# Patient Record
Sex: Male | Born: 1965 | Race: White | Hispanic: No | Marital: Married | Smoking: Former smoker
Health system: Southern US, Community
[De-identification: ages and names within clinical notes are randomized; demographics above are authoritative.]

## PROBLEM LIST (undated history)

## (undated) DIAGNOSIS — F32A Depression, unspecified: Secondary | ICD-10-CM

## (undated) DIAGNOSIS — F419 Anxiety disorder, unspecified: Secondary | ICD-10-CM

## (undated) HISTORY — PX: OTHER SURGICAL HISTORY: SHX169

---

## 2007-05-19 ENCOUNTER — Emergency Department (HOSPITAL_COMMUNITY): Admission: EM | Admit: 2007-05-19 | Discharge: 2007-05-19 | Payer: Self-pay | Admitting: Emergency Medicine

## 2007-10-16 ENCOUNTER — Encounter: Admission: RE | Admit: 2007-10-16 | Discharge: 2007-10-16 | Payer: Self-pay | Admitting: Orthopedic Surgery

## 2012-08-26 ENCOUNTER — Emergency Department (HOSPITAL_COMMUNITY): Payer: BC Managed Care – PPO

## 2012-08-26 ENCOUNTER — Emergency Department (HOSPITAL_COMMUNITY)
Admission: EM | Admit: 2012-08-26 | Discharge: 2012-08-27 | Disposition: A | Discharge status: Home / Self Care | Payer: BC Managed Care – PPO | Attending: Emergency Medicine | Admitting: Emergency Medicine

## 2012-08-26 ENCOUNTER — Encounter (HOSPITAL_COMMUNITY): Payer: Self-pay | Admitting: *Deleted

## 2012-08-26 DIAGNOSIS — M25519 Pain in unspecified shoulder: Secondary | ICD-10-CM | POA: Insufficient documentation

## 2012-08-26 MED ORDER — OXYCODONE-ACETAMINOPHEN 5-325 MG PO TABS
2.0000 | ORAL_TABLET | Freq: Once | ORAL | Status: AC
  Administered 2012-08-26: 2 via ORAL
  Filled 2012-08-26: qty 2

## 2012-08-26 NOTE — ED Notes (Signed)
The pt has a painful swollen rt clavicle he was playing softball and he collided with another player.

## 2012-08-27 ENCOUNTER — Other Ambulatory Visit (HOSPITAL_COMMUNITY): Payer: Self-pay

## 2012-08-27 ENCOUNTER — Emergency Department (HOSPITAL_COMMUNITY): Payer: BC Managed Care – PPO

## 2012-08-27 MED ORDER — OXYCODONE-ACETAMINOPHEN 5-325 MG PO TABS: 1.0000 | ORAL_TABLET | Freq: Four times a day (QID) | ORAL | Status: AC | PRN

## 2012-08-27 MED ORDER — NAPROXEN 500 MG PO TABS: 500.0000 mg | ORAL_TABLET | Freq: Two times a day (BID) | ORAL | Status: AC

## 2012-08-27 NOTE — ED Provider Notes (Signed)
History     CSN: [id number]  Arrival date & time 08/26/12  2204   First MD Initiated Contact with Patient 08/26/12 2344      Chief Complaint  Patient presents with  . Shoulder Injury    (Consider location/radiation/quality/duration/timing/severity/associated sxs/prior treatment) HPI Comments: Patient presents with complaint of right shoulder pain that began acutely when he collided with another player while playing softball. Injury occurred at approximately 9 PM. No treatments prior to arrival. Patient has pain and swelling over his right shoulder anteriorly in the area of the ac joint. Pain is constant. He denies numbness, tingling in hands. Patient's states that movement makes the pain worse. Nothing makes it better.  Patient is a 46 y.o. male presenting with shoulder injury. The history is provided by the patient and the spouse.  Shoulder Injury This is a new problem. The current episode started today. The problem occurs constantly. The problem has been unchanged. Associated symptoms include arthralgias. Pertinent negatives include no joint swelling, neck pain, numbness or weakness. The symptoms are aggravated by bending. He has tried nothing for the symptoms.    History reviewed. No pertinent past medical history.  History reviewed. No pertinent past surgical history.  No family history on file.  History  Substance Use Topics  . Smoking status: Never Smoker   . Smokeless tobacco: Not on file  . Alcohol Use: Yes      Review of Systems  HENT: Negative for neck pain.   Musculoskeletal: Positive for arthralgias. Negative for back pain, joint swelling and gait problem.  Skin: Negative for wound.  Neurological: Negative for weakness and numbness.    Allergies  Review of patient's allergies indicates no known allergies.  Home Medications   Current Outpatient Rx  Name Route Sig Dispense Refill  . IBUPROFEN 200 MG PO TABS Oral Take 200 mg by mouth every 6 (six) hours as  needed. For pain      BP 105/72  Pulse 72  Temp 98.8 F (37.1 C) (Oral)  Resp 16  SpO2 99%  Physical Exam  Nursing note and vitals reviewed. Constitutional: He is oriented to person, place, and time. He appears well-developed and well-nourished.  HENT:  Head: Normocephalic and atraumatic.  Eyes: Conjunctivae are normal.  Neck: Normal range of motion. Neck supple.  Cardiovascular: Normal pulses.   Pulses:      Radial pulses are 2+ on the right side, and 2+ on the left side.  Musculoskeletal: He exhibits tenderness. He exhibits no edema.       Right shoulder: He exhibits decreased range of motion, tenderness (Over AC joint), bony tenderness and pain. He exhibits no swelling, no effusion and no deformity.       Right elbow: Normal.      Right wrist: Normal.       Cervical back: Normal. He exhibits normal range of motion and no tenderness.       Right upper arm: Normal.       Right forearm: Normal.       Patient cannot externally rotate or lift arm due to pain. 2+ radial pulses. Compartments of forearm soft.   Neurological: He is alert and oriented to person, place, and time. No sensory deficit.       Motor, sensation, and vascular distal to the injury is fully intact.   Skin: Skin is warm and dry.  Psychiatric: He has a normal mood and affect.    ED Course  Procedures (including critical care time)  Labs  Reviewed - No data to display Dg Clavicle Right  08/26/2012  *RADIOLOGY REPORT*  Clinical Data: Anterior right shoulder and superior clavicle pain after falling and landing on the shoulder, feeling a pop.  RIGHT CLAVICLE - 2+ VIEWS  Comparison: None.  Findings: Two views of the right clavicle demonstrate normal appearing bones and soft tissues without fracture or dislocation.  IMPRESSION: Normal examination.   Original Report Authenticated By: Darrol Angel, M.D.      1. Shoulder pain     11:45 PM Patient seen and examined. X-rays reviewed. Additional shoulder views  ordered. Medications ordered. Sling by ortho tech.   Vital signs reviewed and are as follows: Filed Vitals:   08/26/12 2234  BP: 105/72  Pulse: 72  Temp: 98.8 F (37.1 C)  Resp: 16   1:00 AM patient informed of x-ray findings. Urged followup with orthopedic physician if not improved in 5-7 days. Counseled patient on rice protocol. Counseled patient not to use right arm for the next week.  Patient counseled on use of narcotic pain medications. Counseled not to combine these medications with others containing tylenol. Urged not to drink alcohol, drive, or perform any other activities that requires focus while taking these medications. The patient verbalizes understanding and agrees with the plan.   No contraindcations of NSAID use.   MDM  Patient with likely type 1/type II acromioclavicular injury. Will treat conservatively with rice protocol, swelling, pain medicine, anti-inflammatory medications. Patient has an orthopedic doctor for followup if not improved. Patient counseled to rest shoulder. X-rays are negative for fracture, dislocation. Compartments of forearm are soft. 2+ radial pulse bilaterally. Do not suspect hand, wrist, or elbow injuries. No neck injury.        Renne Crigler, [place] 08/27/12 [phone]

## 2012-08-27 NOTE — ED Provider Notes (Signed)
Medical screening examination/treatment/procedure(s) were performed by non-physician practitioner and as supervising physician I was immediately available for consultation/collaboration.    Vida Roller, MD 08/27/12 [phone]

## 2013-09-21 IMAGING — CR DG CLAVICLE*R*
2 series · 2 of 2 positions shown · non-contrast
Comparison: None.

CLINICAL DATA: Anterior right shoulder and superior clavicle pain
after falling and landing on the shoulder, feeling a pop.

RIGHT CLAVICLE - 2+ VIEWS

[w clavicle ap right *]
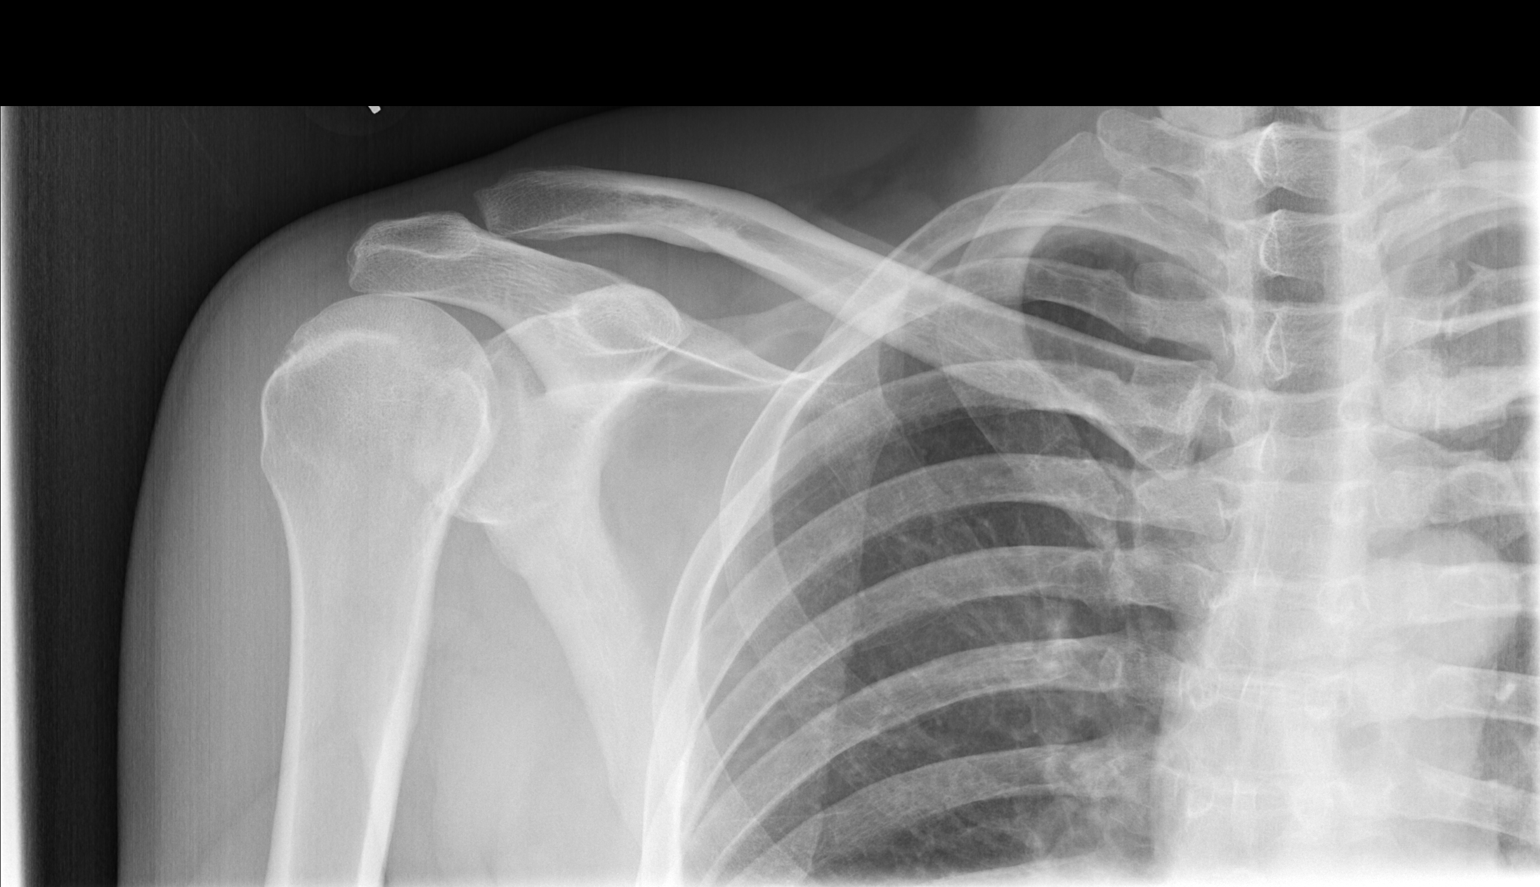

[w clavicle tangential right *]
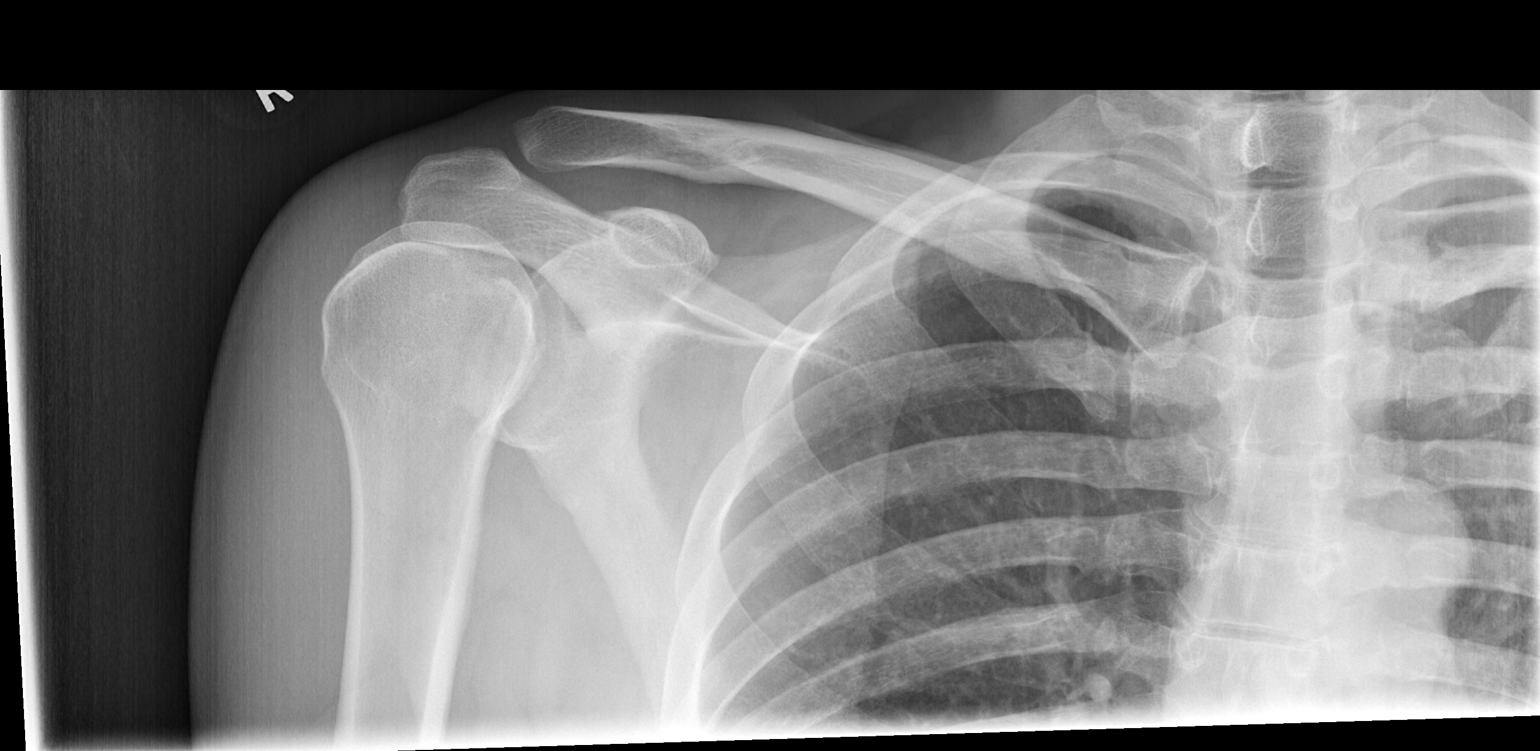

[2 of 2 positions shown; findings below may reference images not displayed]

FINDINGS: Two views of the right clavicle demonstrate normal
appearing bones and soft tissues without fracture or dislocation.
IMPRESSION: Normal examination.

## 2013-09-22 IMAGING — CR DG SHOULDER 2+V*R*
2 series · 2 of 2 positions shown · non-contrast
Comparison: Right clavicle 08/26/2012.

CLINICAL DATA: Right shoulder pain after fall.

RIGHT SHOULDER - 2+ VIEW

[w shoulder ap internal righ]
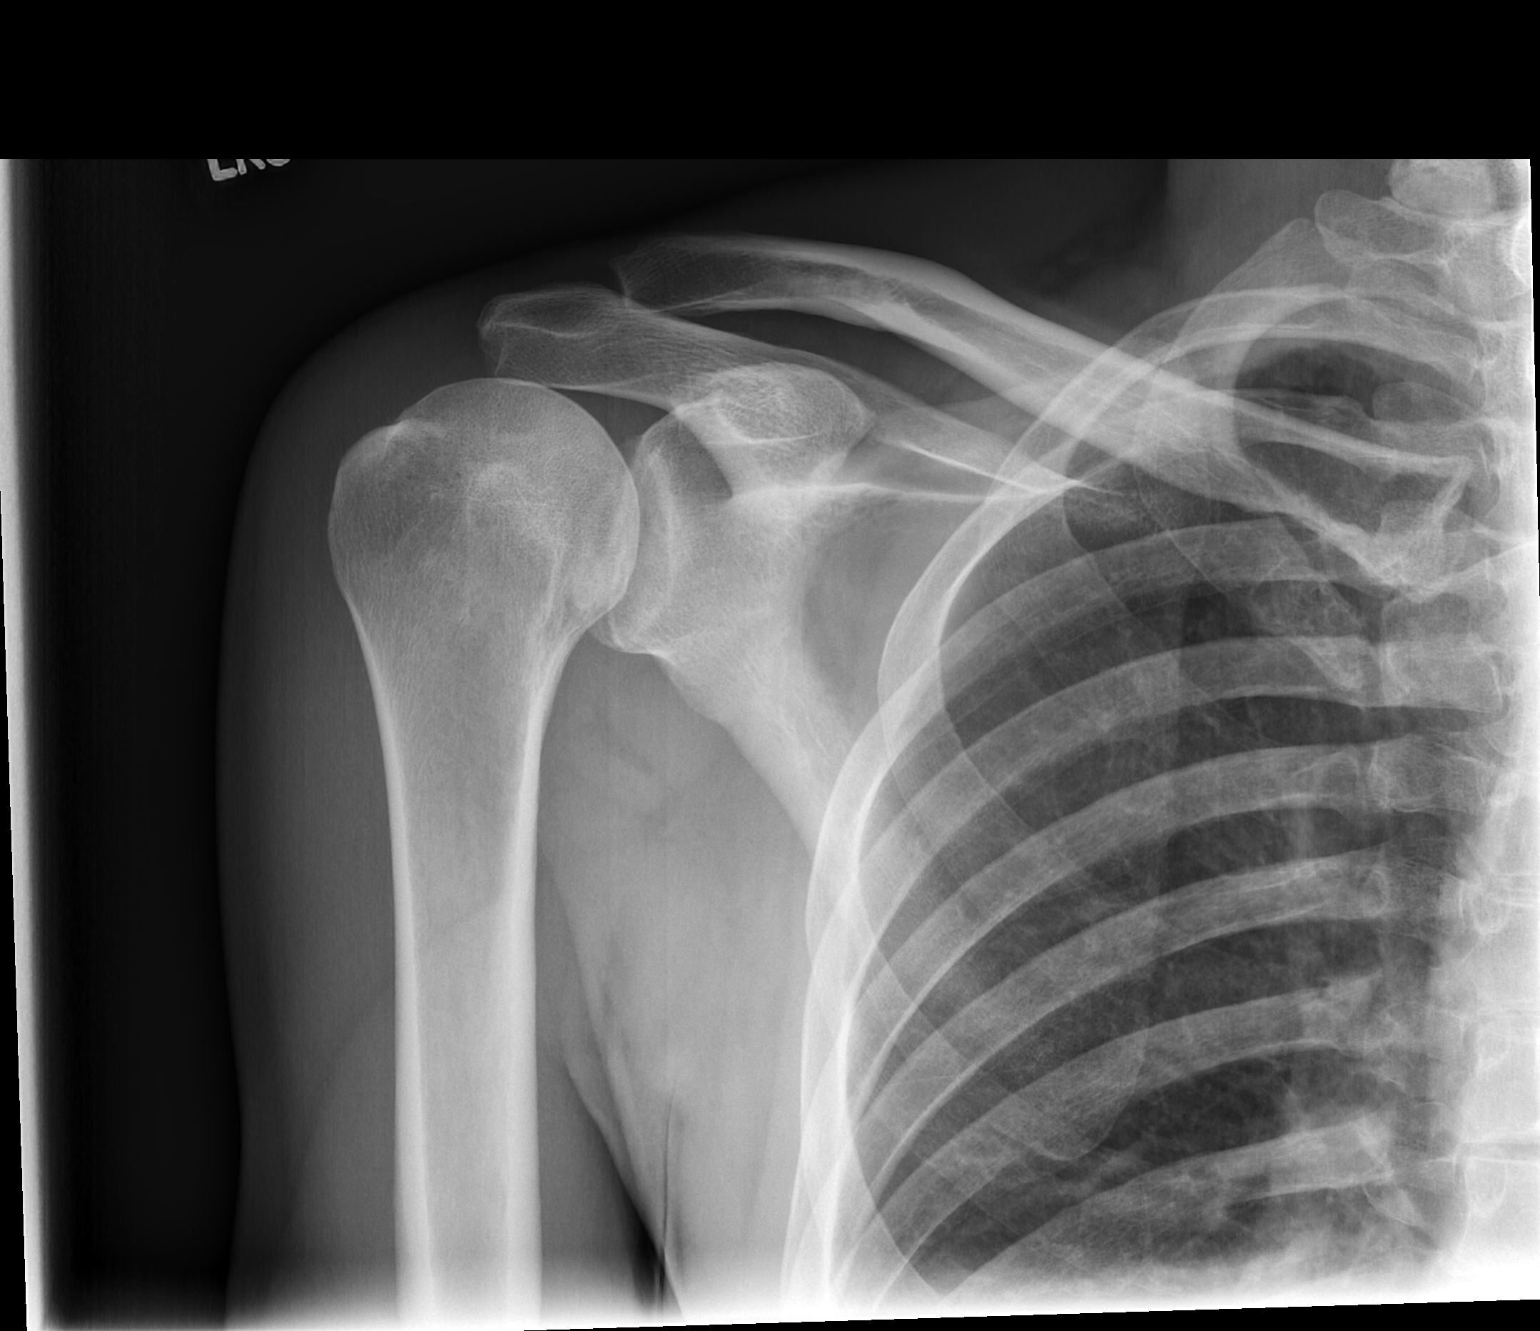

[w shoulder ap external righ]
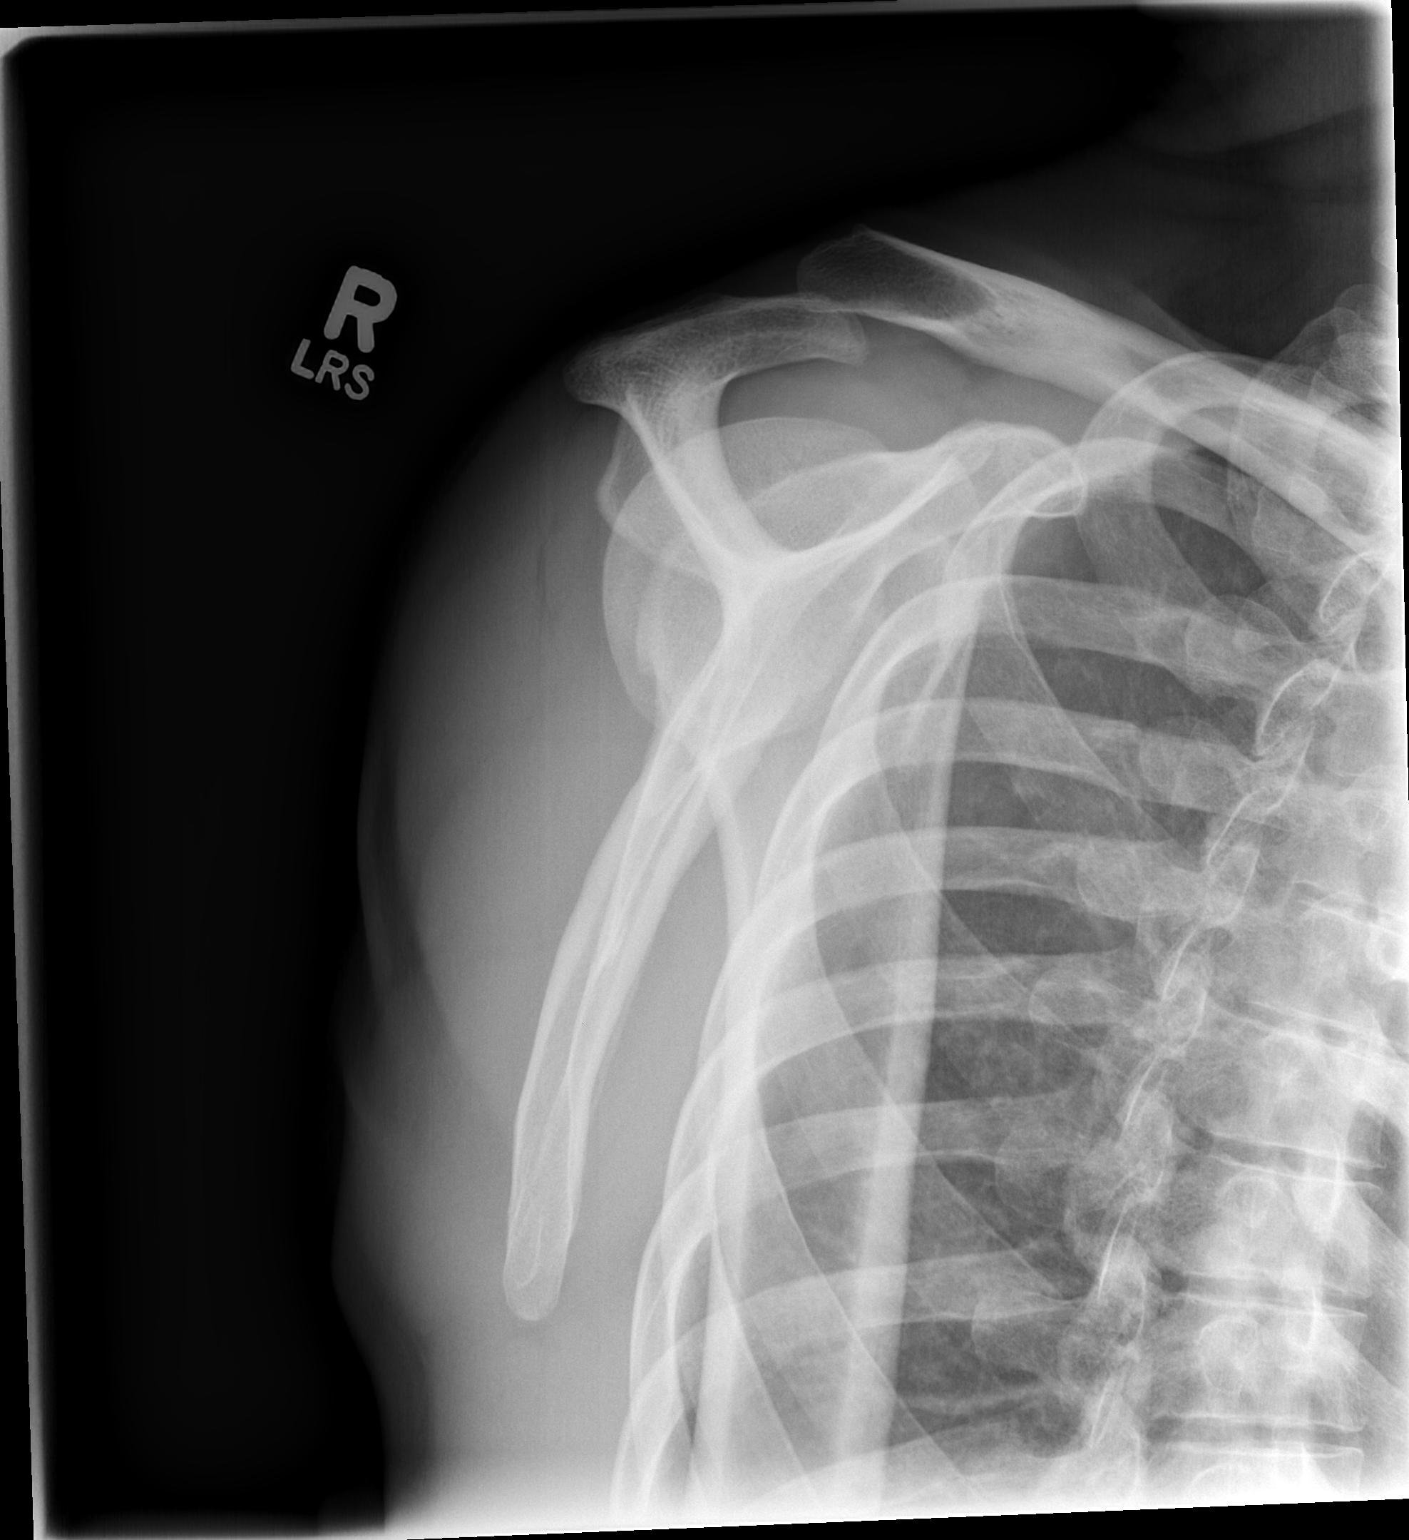

[2 of 2 positions shown; findings below may reference images not displayed]

FINDINGS: Right shoulder appears intact. No evidence of acute
fracture or subluxation.  No focal bone lesions.  Bone matrix and
cortex appear intact.  No abnormal radiopaque densities in the soft
tissues.
IMPRESSION: No acute bony abnormalities.

## 2022-03-30 ENCOUNTER — Other Ambulatory Visit: Payer: Self-pay | Admitting: Urology

## 2022-05-08 NOTE — Progress Notes (Addendum)
DUE TO COVID-19 ONLY  2 VISITOR IS ALLOWED TO COME WITH YOU AND STAY IN THE WAITING ROOM ONLY DURING PRE OP AND PROCEDURE DAY OF SURGERY.   4 VISITOR  MAY VISIT WITH YOU AFTER SURGERY IN YOUR PRIVATE ROOM DURING VISITING HOURS ONLY! YOU MAY HAVE ONE PERSON SPEND THE NITE WITH YOU IN YOUR ROOM AFTER SURGERY.      Your procedure is scheduled on:                          05/17/22   Report to [facility] Main  Entrance   Report to admitting at   0945am              AM DO NOT BRING INSURANCE CARD, PICTURE ID OR WALLET DAY OF SURGERY.      Call this number if you have problems the morning of surgery [phone]  Follow bowel prep instructions per DR Louis Meckel.     REMEMBER: NO  SOLID FOODS , CANDY, GUM OR MINTS AFTER MIDNITE THE NITE BEFORE SURGERY .       Marland Kitchen CLEAR LIQUIDS UNTIL   0900am morning of surgery.         CLEAR LIQUID DIET   Foods Allowed      WATER BLACK COFFEE ( SUGAR OK, NO MILK, CREAM OR CREAMER) REGULAR AND DECAF  TEA ( SUGAR OK NO MILK, CREAM, OR CREAMER) REGULAR AND DECAF  PLAIN JELLO ( NO RED)  FRUIT ICES ( NO RED, NO FRUIT PULP)  POPSICLES ( NO RED)  JUICE- APPLE, WHITE GRAPE AND WHITE CRANBERRY  SPORT DRINK LIKE GATORADE ( NO RED)  CLEAR BROTH ( VEGETABLE , CHICKEN OR BEEF)                                                                     BRUSH YOUR TEETH MORNING OF SURGERY AND RINSE YOUR MOUTH OUT, NO CHEWING GUM CANDY OR MINTS.     Take these medicines the morning of surgery with A SIP OF WATER:  zyrtec if needed    DO NOT TAKE ANY DIABETIC MEDICATIONS DAY OF YOUR SURGERY                               You may not have any metal on your body including hair pins and              piercings  Do not wear jewelry, make-up, lotions, powders or perfumes, deodorant             Do not wear nail polish on your fingernails.              IF YOU ARE A MALE AND WANT TO SHAVE UNDER ARMS OR LEGS PRIOR TO SURGERY YOU MUST DO SO AT LEAST 48 HOURS PRIOR TO  SURGERY.              Men may shave face and neck.   Do not bring valuables to the hospital. [place].  Contacts, dentures or bridgework may not  be worn into surgery.  Leave suitcase in the car. After surgery it may be brought to your room.     Patients discharged the day of surgery will not be allowed to drive home. IF YOU ARE HAVING SURGERY AND GOING HOME THE SAME DAY, YOU MUST HAVE AN ADULT TO DRIVE YOU HOME AND BE WITH YOU FOR 24 HOURS. YOU MAY GO HOME BY TAXI OR UBER OR ORTHERWISE, BUT AN ADULT MUST ACCOMPANY YOU HOME AND STAY WITH YOU FOR 24 HOURS.                Please read over the following fact sheets you were given: _____________________________________________________________________  [facility] - Preparing for Surgery Before surgery, you can play an important role.  Because skin is not sterile, your skin needs to be as free of germs as possible.  You can reduce the number of germs on your skin by washing with CHG (chlorahexidine gluconate) soap before surgery.  CHG is an antiseptic cleaner which kills germs and bonds with the skin to continue killing germs even after washing. Please DO NOT use if you have an allergy to CHG or antibacterial soaps.  If your skin becomes reddened/irritated stop using the CHG and inform your nurse when you arrive at Short Stay. Do not shave (including legs and underarms) for at least 48 hours prior to the first CHG shower.  You may shave your face/neck. Please follow these instructions carefully:  1.  Shower with CHG Soap the night before surgery and the  morning of Surgery.  2.  If you choose to wash your hair, wash your hair first as usual with your  normal  shampoo.  3.  After you shampoo, rinse your hair and body thoroughly to remove the  shampoo.                           4.  Use CHG as you would any other liquid soap.  You can apply chg directly  to the skin and wash                       Gently  with a scrungie or clean washcloth.  5.  Apply the CHG Soap to your body ONLY FROM THE NECK DOWN.   Do not use on face/ open                           Wound or open sores. Avoid contact with eyes, ears mouth and genitals (private parts).                       Wash face,  Genitals (private parts) with your normal soap.             6.  Wash thoroughly, paying special attention to the area where your surgery  will be performed.  7.  Thoroughly rinse your body with warm water from the neck down.  8.  DO NOT shower/wash with your normal soap after using and rinsing off  the CHG Soap.                9.  Pat yourself dry with a clean towel.            10.  Wear clean pajamas.            11.  Place clean sheets on your bed  the night of your first shower and do not  sleep with pets. Day of Surgery : Do not apply any lotions/deodorants the morning of surgery.  Please wear clean clothes to the hospital/surgery center.  FAILURE TO FOLLOW THESE INSTRUCTIONS MAY RESULT IN THE CANCELLATION OF YOUR SURGERY PATIENT SIGNATURE_________________________________  NURSE SIGNATURE__________________________________  ________________________________________________________________________

## 2022-05-08 NOTE — Progress Notes (Signed)
LVMMM for Jim Warner at [facility] regarding preop orders .  NO  clear liquid diet ordered for day before surgery and miralax order is not complete.

## 2022-05-08 NOTE — Progress Notes (Signed)
Anesthesia Review:  PCP: Cardiologist : Chest x-ray : EKG : Echo : Stress test: Cardiac Cath :  Activity level:  Sleep Study/ CPAP : Fasting Blood Sugar :      / Checks Blood Sugar -- times a day:   Blood Thinner/ Instructions /Last Dose: ASA / Instructions/ Last Dose :  

## 2022-05-10 ENCOUNTER — Encounter (HOSPITAL_COMMUNITY): Payer: Self-pay

## 2022-05-10 ENCOUNTER — Other Ambulatory Visit: Payer: Self-pay

## 2022-05-10 ENCOUNTER — Encounter (HOSPITAL_COMMUNITY)
Admission: RE | Admit: 2022-05-10 | Discharge: 2022-05-10 | Disposition: A | Discharge status: Home / Self Care | Payer: No Typology Code available for payment source | Source: Ambulatory Visit | Attending: Urology | Admitting: Urology

## 2022-05-10 VITALS — BP 121/83 | HR 59 | Temp 97.8°F | Resp 16 | Ht 73.0 in | Wt 179.0 lb

## 2022-05-10 DIAGNOSIS — Z01812 Encounter for preprocedural laboratory examination: Secondary | ICD-10-CM | POA: Insufficient documentation

## 2022-05-10 DIAGNOSIS — Z01818 Encounter for other preprocedural examination: Secondary | ICD-10-CM

## 2022-05-10 HISTORY — DX: Anxiety disorder, unspecified: F41.9

## 2022-05-10 HISTORY — DX: Depression, unspecified: F32.A

## 2022-05-10 LAB — CBC
HCT: 44.8 % (ref 39.0–52.0)
Hemoglobin: 15.5 g/dL (ref 13.0–17.0)
MCH: 32.3 pg (ref 26.0–34.0)
MCHC: 34.6 g/dL (ref 30.0–36.0)
MCV: 93.3 fL (ref 80.0–100.0)
Platelets: 284 10*3/uL (ref 150–400)
RBC: 4.8 MIL/uL (ref 4.22–5.81)
RDW: 12.1 % (ref 11.5–15.5)
WBC: 6 10*3/uL (ref 4.0–10.5)
nRBC: 0 % (ref 0.0–0.2)

## 2022-05-10 LAB — COMPREHENSIVE METABOLIC PANEL
ALT: 57 U/L — ABNORMAL HIGH (ref 0–44)
AST: 56 U/L — ABNORMAL HIGH (ref 15–41)
Albumin: 4.3 g/dL (ref 3.5–5.0)
Alkaline Phosphatase: 65 U/L (ref 38–126)
Anion gap: 7 (ref 5–15)
BUN: 10 mg/dL (ref 6–20)
CO2: 26 mmol/L (ref 22–32)
Calcium: 9.3 mg/dL (ref 8.9–10.3)
Chloride: 107 mmol/L (ref 98–111)
Creatinine, Ser: 0.8 mg/dL (ref 0.61–1.24)
GFR, Estimated: >60 mL/min (ref >60)
Glucose, Bld: 81 mg/dL (ref 70–99)
Potassium: 3.9 mmol/L (ref 3.5–5.1)
Sodium: 140 mmol/L (ref 135–145)
Total Bilirubin: 0.7 mg/dL (ref 0.3–1.2)
Total Protein: 7.5 g/dL (ref 6.5–8.1)

## 2022-05-11 LAB — URINE CULTURE: Culture: NO GROWTH

## 2022-05-16 NOTE — Anesthesia Preprocedure Evaluation (Addendum)
Anesthesia Evaluation  Patient identified by MRN, date of birth, ID band Patient awake    Reviewed: Allergy & Precautions, NPO status , Patient's Chart, lab work & pertinent test results  Airway Mallampati: I  TM Distance: >3 FB Neck ROM: Full    Dental no notable dental hx. (+) Teeth Intact, Dental Advisory Given   Pulmonary former smoker,    Pulmonary exam normal breath sounds clear to auscultation       Cardiovascular Exercise Tolerance: Good Normal cardiovascular exam Rhythm:Regular Rate:Normal     Neuro/Psych Anxiety Depression    GI/Hepatic negative GI ROS, Neg liver ROS,   Endo/Other  negative endocrine ROS  Renal/GU Lab Results      Component                Value               Date                      CREATININE               0.80                05/10/2022               K                        3.9                 05/10/2022                    Musculoskeletal  (+) Arthritis ,   Abdominal   Peds  Hematology Lab Results      Component                Value               Date                         HGB                      15.5                05/10/2022                HCT                      44.8                05/10/2022                    PLT                      284                 05/10/2022              Anesthesia Other Findings   Reproductive/Obstetrics                           Anesthesia Physical Anesthesia Plan  ASA: 3  Anesthesia Plan: General   Post-op Pain Management: Ketamine IV*, Lidocaine infusion* and Ofirmev IV (intra-op)*   Induction: Intravenous  PONV Risk Score and Plan: 3 and Treatment may vary due to age or medical condition,  Midazolam, Dexamethasone and Ondansetron  Airway Management Planned: Oral ETT  Additional Equipment: None  Intra-op Plan:   Post-operative Plan:   Informed Consent: I have reviewed the patients History and Physical, chart,  labs and discussed the procedure including the risks, benefits and alternatives for the proposed anesthesia with the patient or authorized representative who has indicated his/her understanding and acceptance.     Dental advisory given  Plan Discussed with: CRNA and Anesthesiologist  Anesthesia Plan Comments:        Anesthesia Quick Evaluation

## 2022-05-17 ENCOUNTER — Encounter (HOSPITAL_COMMUNITY)
Admission: RE | Disposition: A | Discharge status: Home / Self Care | Payer: Self-pay | Source: Ambulatory Visit | Attending: Urology

## 2022-05-17 ENCOUNTER — Ambulatory Visit (HOSPITAL_BASED_OUTPATIENT_CLINIC_OR_DEPARTMENT_OTHER): Payer: No Typology Code available for payment source | Admitting: Certified Registered Nurse Anesthetist

## 2022-05-17 ENCOUNTER — Encounter (HOSPITAL_COMMUNITY): Payer: Self-pay | Admitting: Urology

## 2022-05-17 ENCOUNTER — Other Ambulatory Visit: Payer: Self-pay

## 2022-05-17 ENCOUNTER — Ambulatory Visit (HOSPITAL_COMMUNITY): Payer: No Typology Code available for payment source | Admitting: Physician Assistant

## 2022-05-17 ENCOUNTER — Observation Stay (HOSPITAL_COMMUNITY)
Admission: RE | Admit: 2022-05-17 | Discharge: 2022-05-18 | Disposition: A | Discharge status: Home / Self Care | Payer: No Typology Code available for payment source | Source: Ambulatory Visit | Attending: Urology | Admitting: Urology

## 2022-05-17 DIAGNOSIS — M1990 Unspecified osteoarthritis, unspecified site: Secondary | ICD-10-CM

## 2022-05-17 DIAGNOSIS — F418 Other specified anxiety disorders: Secondary | ICD-10-CM | POA: Diagnosis not present

## 2022-05-17 DIAGNOSIS — C61 Malignant neoplasm of prostate: Secondary | ICD-10-CM

## 2022-05-17 DIAGNOSIS — Z01818 Encounter for other preprocedural examination: Secondary | ICD-10-CM

## 2022-05-17 HISTORY — PX: ROBOT ASSISTED LAPAROSCOPIC RADICAL PROSTATECTOMY: SHX5141

## 2022-05-17 HISTORY — PX: PELVIC LYMPH NODE DISSECTION: SHX6543

## 2022-05-17 LAB — TYPE AND SCREEN
ABO/RH(D): O POS
Antibody Screen: NEGATIVE

## 2022-05-17 LAB — HEMOGLOBIN AND HEMATOCRIT, BLOOD
HCT: 45 % (ref 39.0–52.0)
Hemoglobin: 14.6 g/dL (ref 13.0–17.0)

## 2022-05-17 LAB — ABO/RH: ABO/RH(D): O POS

## 2022-05-17 SURGERY — PROSTATECTOMY, RADICAL, ROBOT-ASSISTED, LAPAROSCOPIC
Anesthesia: General | Site: Abdomen

## 2022-05-17 MED ORDER — HYDROMORPHONE HCL 2 MG/ML IJ SOLN
INTRAMUSCULAR | Status: AC
  Filled 2022-05-17: qty 1

## 2022-05-17 MED ORDER — FENTANYL CITRATE (PF) 250 MCG/5ML IJ SOLN
INTRAMUSCULAR | Status: AC
  Filled 2022-05-17: qty 5

## 2022-05-17 MED ORDER — AMISULPRIDE (ANTIEMETIC) 5 MG/2ML IV SOLN: 10.0000 mg | Freq: Once | INTRAVENOUS | Status: DC | PRN

## 2022-05-17 MED ORDER — TRAMADOL HCL 50 MG PO TABS
50.0000 mg | ORAL_TABLET | Freq: Four times a day (QID) | ORAL | Status: DC | PRN
  Administered 2022-05-17 – 2022-05-18 (×3): 100 mg via ORAL
  Filled 2022-05-17 (×3): qty 2

## 2022-05-17 MED ORDER — HYOSCYAMINE SULFATE 0.125 MG SL SUBL
0.1250 mg | SUBLINGUAL_TABLET | SUBLINGUAL | Status: DC | PRN
  Administered 2022-05-17: 0.125 mg via SUBLINGUAL
  Filled 2022-05-17 (×2): qty 1

## 2022-05-17 MED ORDER — MIDAZOLAM HCL 5 MG/5ML IJ SOLN
INTRAMUSCULAR | Status: DC | PRN
  Administered 2022-05-17: 2 mg via INTRAVENOUS

## 2022-05-17 MED ORDER — POLYETHYLENE GLYCOL 3350 17 GM/SCOOP PO POWD: 1.0000 | Freq: Once | ORAL | Status: DC

## 2022-05-17 MED ORDER — OXYCODONE HCL 5 MG PO TABS: 5.0000 mg | ORAL_TABLET | Freq: Once | ORAL | Status: DC | PRN

## 2022-05-17 MED ORDER — DEXAMETHASONE SODIUM PHOSPHATE 10 MG/ML IJ SOLN
INTRAMUSCULAR | Status: AC
  Filled 2022-05-17: qty 1

## 2022-05-17 MED ORDER — ROCURONIUM BROMIDE 10 MG/ML (PF) SYRINGE
PREFILLED_SYRINGE | INTRAVENOUS | Status: AC
  Filled 2022-05-17: qty 10

## 2022-05-17 MED ORDER — LIDOCAINE HCL (PF) 2 % IJ SOLN
INTRAMUSCULAR | Status: DC | PRN
  Administered 2022-05-17: 1.5 mg/kg/h via INTRADERMAL

## 2022-05-17 MED ORDER — SODIUM CHLORIDE 0.9 % IV BOLUS
1000.0000 mL | Freq: Once | INTRAVENOUS | Status: AC
  Administered 2022-05-17: 1000 mL via INTRAVENOUS

## 2022-05-17 MED ORDER — FENTANYL CITRATE (PF) 100 MCG/2ML IJ SOLN
INTRAMUSCULAR | Status: DC | PRN
Start: 2022-05-17 — End: 2022-05-17
  Administered 2022-05-17: 100 ug via INTRAVENOUS
  Administered 2022-05-17: 50 ug via INTRAVENOUS
  Administered 2022-05-17: 100 ug via INTRAVENOUS
  Administered 2022-05-17 (×2): 50 ug via INTRAVENOUS

## 2022-05-17 MED ORDER — LIDOCAINE HCL 2 % IJ SOLN
INTRAMUSCULAR | Status: AC
  Filled 2022-05-17: qty 20

## 2022-05-17 MED ORDER — DIPHENHYDRAMINE HCL 50 MG/ML IJ SOLN: 12.5000 mg | Freq: Four times a day (QID) | INTRAMUSCULAR | Status: DC | PRN

## 2022-05-17 MED ORDER — HYDROMORPHONE HCL 1 MG/ML IJ SOLN
0.5000 mg | INTRAMUSCULAR | Status: DC | PRN
  Administered 2022-05-17 – 2022-05-18 (×3): 1 mg via INTRAVENOUS
  Filled 2022-05-17 (×4): qty 1

## 2022-05-17 MED ORDER — PROPOFOL 10 MG/ML IV BOLUS
INTRAVENOUS | Status: DC | PRN
  Administered 2022-05-17: 180 mg via INTRAVENOUS

## 2022-05-17 MED ORDER — ACETAMINOPHEN 10 MG/ML IV SOLN
INTRAVENOUS | Status: DC | PRN
  Administered 2022-05-17: 1000 mg via INTRAVENOUS

## 2022-05-17 MED ORDER — SUGAMMADEX SODIUM 200 MG/2ML IV SOLN
INTRAVENOUS | Status: DC | PRN
  Administered 2022-05-17: 200 mg via INTRAVENOUS

## 2022-05-17 MED ORDER — HYDROMORPHONE HCL 1 MG/ML IJ SOLN: 0.2500 mg | INTRAMUSCULAR | Status: DC | PRN

## 2022-05-17 MED ORDER — MIDAZOLAM HCL 2 MG/2ML IJ SOLN
INTRAMUSCULAR | Status: AC
Start: 2022-05-17 — End: ?
  Filled 2022-05-17: qty 2

## 2022-05-17 MED ORDER — DEXAMETHASONE SODIUM PHOSPHATE 4 MG/ML IJ SOLN
INTRAMUSCULAR | Status: DC | PRN
  Administered 2022-05-17: 5 mg via INTRAVENOUS

## 2022-05-17 MED ORDER — DEXTROSE-NACL 5-0.45 % IV SOLN: INTRAVENOUS | Status: DC

## 2022-05-17 MED ORDER — BUPIVACAINE LIPOSOME 1.3 % IJ SUSP
INTRAMUSCULAR | Status: AC
  Filled 2022-05-17: qty 20

## 2022-05-17 MED ORDER — BUPIVACAINE-EPINEPHRINE 0.5% -1:200000 IJ SOLN
INTRAMUSCULAR | Status: DC | PRN
  Administered 2022-05-17: 10 mL
  Administered 2022-05-17: 20 mL

## 2022-05-17 MED ORDER — TRIPLE ANTIBIOTIC 3.5-400-5000 EX OINT
1.0000 "application " | TOPICAL_OINTMENT | Freq: Three times a day (TID) | CUTANEOUS | Status: DC | PRN
  Filled 2022-05-17: qty 1

## 2022-05-17 MED ORDER — CEFAZOLIN SODIUM-DEXTROSE 2-4 GM/100ML-% IV SOLN
INTRAVENOUS | Status: AC
  Filled 2022-05-17: qty 100

## 2022-05-17 MED ORDER — SULFAMETHOXAZOLE-TRIMETHOPRIM 800-160 MG PO TABS: 1.0000 | ORAL_TABLET | Freq: Two times a day (BID) | ORAL | 0 refills | Status: DC

## 2022-05-17 MED ORDER — OXYCODONE HCL 5 MG/5ML PO SOLN: 5.0000 mg | Freq: Once | ORAL | Status: DC | PRN

## 2022-05-17 MED ORDER — ACETAMINOPHEN 10 MG/ML IV SOLN
INTRAVENOUS | Status: AC
  Filled 2022-05-17: qty 100

## 2022-05-17 MED ORDER — ONDANSETRON HCL 4 MG/2ML IJ SOLN
INTRAMUSCULAR | Status: DC | PRN
  Administered 2022-05-17: 4 mg via INTRAVENOUS

## 2022-05-17 MED ORDER — DEXMEDETOMIDINE (PRECEDEX) IN NS 20 MCG/5ML (4 MCG/ML) IV SYRINGE
PREFILLED_SYRINGE | INTRAVENOUS | Status: DC | PRN
  Administered 2022-05-17: 8 ug via INTRAVENOUS

## 2022-05-17 MED ORDER — KETAMINE HCL 10 MG/ML IJ SOLN
INTRAMUSCULAR | Status: DC | PRN
  Administered 2022-05-17: 30 mg via INTRAVENOUS

## 2022-05-17 MED ORDER — TRAMADOL HCL 50 MG PO TABS: 50.0000 mg | ORAL_TABLET | Freq: Four times a day (QID) | ORAL | 0 refills | Status: DC | PRN

## 2022-05-17 MED ORDER — ONDANSETRON HCL 4 MG/2ML IJ SOLN: 4.0000 mg | Freq: Once | INTRAMUSCULAR | Status: DC | PRN

## 2022-05-17 MED ORDER — DOCUSATE SODIUM 100 MG PO CAPS
100.0000 mg | ORAL_CAPSULE | Freq: Two times a day (BID) | ORAL | Status: DC
  Administered 2022-05-17 – 2022-05-18 (×2): 100 mg via ORAL
  Filled 2022-05-17 (×3): qty 1

## 2022-05-17 MED ORDER — HYDROMORPHONE HCL 1 MG/ML IJ SOLN
INTRAMUSCULAR | Status: DC | PRN
  Administered 2022-05-17 (×2): 1 mg via INTRAVENOUS

## 2022-05-17 MED ORDER — PROPOFOL 10 MG/ML IV BOLUS
INTRAVENOUS | Status: AC
  Filled 2022-05-17: qty 20

## 2022-05-17 MED ORDER — LACTATED RINGERS IR SOLN
Status: DC | PRN
  Administered 2022-05-17: 1000 mL

## 2022-05-17 MED ORDER — SODIUM CHLORIDE (PF) 0.9 % IJ SOLN
INTRAMUSCULAR | Status: AC
  Filled 2022-05-17: qty 20

## 2022-05-17 MED ORDER — HEMOSTATIC AGENTS (NO CHARGE) OPTIME
TOPICAL | Status: DC | PRN
  Administered 2022-05-17: 1

## 2022-05-17 MED ORDER — ACETAMINOPHEN 10 MG/ML IV SOLN: 1000.0000 mg | Freq: Once | INTRAVENOUS | Status: DC | PRN

## 2022-05-17 MED ORDER — LIDOCAINE 2% (20 MG/ML) 5 ML SYRINGE
INTRAMUSCULAR | Status: DC | PRN
  Administered 2022-05-17: 100 mg via INTRAVENOUS

## 2022-05-17 MED ORDER — ONDANSETRON HCL 4 MG/2ML IJ SOLN
4.0000 mg | INTRAMUSCULAR | Status: DC | PRN
  Administered 2022-05-18: 4 mg via INTRAVENOUS
  Filled 2022-05-17: qty 2

## 2022-05-17 MED ORDER — ONDANSETRON HCL 4 MG/2ML IJ SOLN
INTRAMUSCULAR | Status: AC
  Filled 2022-05-17: qty 2

## 2022-05-17 MED ORDER — CEFAZOLIN SODIUM-DEXTROSE 2-4 GM/100ML-% IV SOLN
2.0000 g | INTRAVENOUS | Status: AC
  Administered 2022-05-17: 2 g via INTRAVENOUS

## 2022-05-17 MED ORDER — BUPIVACAINE LIPOSOME 1.3 % IJ SUSP
INTRAMUSCULAR | Status: DC | PRN
  Administered 2022-05-17: 20 mL

## 2022-05-17 MED ORDER — ORAL CARE MOUTH RINSE: 15.0000 mL | Freq: Once | OROMUCOSAL | Status: AC

## 2022-05-17 MED ORDER — DOCUSATE SODIUM 100 MG PO CAPS: 100.0000 mg | ORAL_CAPSULE | Freq: Two times a day (BID) | ORAL | Status: DC

## 2022-05-17 MED ORDER — CHLORHEXIDINE GLUCONATE 0.12 % MT SOLN
15.0000 mL | Freq: Once | OROMUCOSAL | Status: AC
Start: 2022-05-17 — End: 2022-05-17
  Administered 2022-05-17: 15 mL via OROMUCOSAL

## 2022-05-17 MED ORDER — FENTANYL CITRATE (PF) 100 MCG/2ML IJ SOLN
INTRAMUSCULAR | Status: AC
  Filled 2022-05-17: qty 2

## 2022-05-17 MED ORDER — DIPHENHYDRAMINE HCL 12.5 MG/5ML PO ELIX
12.5000 mg | ORAL_SOLUTION | Freq: Four times a day (QID) | ORAL | Status: DC | PRN
  Filled 2022-05-17: qty 10

## 2022-05-17 MED ORDER — STERILE WATER FOR IRRIGATION IR SOLN
Status: DC | PRN
  Administered 2022-05-17: 1000 mL

## 2022-05-17 MED ORDER — ROCURONIUM BROMIDE 10 MG/ML (PF) SYRINGE
PREFILLED_SYRINGE | INTRAVENOUS | Status: DC | PRN
  Administered 2022-05-17: 100 mg via INTRAVENOUS
  Administered 2022-05-17: 50 mg via INTRAVENOUS
  Administered 2022-05-17: 20 mg via INTRAVENOUS

## 2022-05-17 MED ORDER — ACETAMINOPHEN 10 MG/ML IV SOLN
1000.0000 mg | Freq: Four times a day (QID) | INTRAVENOUS | Status: DC
  Administered 2022-05-17 – 2022-05-18 (×3): 1000 mg via INTRAVENOUS
  Filled 2022-05-17 (×3): qty 100

## 2022-05-17 MED ORDER — BUPIVACAINE-EPINEPHRINE 0.5% -1:200000 IJ SOLN
INTRAMUSCULAR | Status: AC
  Filled 2022-05-17: qty 1

## 2022-05-17 MED ORDER — LACTATED RINGERS IV SOLN: INTRAVENOUS | Status: DC

## 2022-05-17 MED ORDER — DEXMEDETOMIDINE (PRECEDEX) IN NS 20 MCG/5ML (4 MCG/ML) IV SYRINGE
PREFILLED_SYRINGE | INTRAVENOUS | Status: AC
Start: 2022-05-17 — End: ?
  Filled 2022-05-17: qty 5

## 2022-05-17 SURGICAL SUPPLY — 62 items
APPLICATOR ARISTA FLEXITIP XL (MISCELLANEOUS) ×1 IMPLANT
APPLICATOR COTTON TIP 6 STRL (MISCELLANEOUS) ×2 IMPLANT
APPLICATOR COTTON TIP 6IN STRL (MISCELLANEOUS) ×3
BAG COUNTER SPONGE SURGICOUNT (BAG) IMPLANT
CATH FOLEY 2WAY SLVR 18FR 30CC (CATHETERS) ×3 IMPLANT
CATH TIEMANN FOLEY 18FR 5CC (CATHETERS) ×3 IMPLANT
CHLORAPREP W/TINT 26 (MISCELLANEOUS) ×3 IMPLANT
CLIP LIGATING HEM O LOK PURPLE (MISCELLANEOUS) ×6 IMPLANT
COVER SURGICAL LIGHT HANDLE (MISCELLANEOUS) ×3 IMPLANT
COVER TIP SHEARS 8 DVNC (MISCELLANEOUS) ×2 IMPLANT
COVER TIP SHEARS 8MM DA VINCI (MISCELLANEOUS) ×3
CUTTER ECHEON FLEX ENDO 45 340 (ENDOMECHANICALS) ×4 IMPLANT
DERMABOND ADVANCED (GAUZE/BANDAGES/DRESSINGS) ×1
DERMABOND ADVANCED .7 DNX12 (GAUZE/BANDAGES/DRESSINGS) ×2 IMPLANT
DRAIN CHANNEL RND F F (WOUND CARE) IMPLANT
DRAPE ARM DVNC X/XI (DISPOSABLE) ×8 IMPLANT
DRAPE COLUMN DVNC XI (DISPOSABLE) ×2 IMPLANT
DRAPE DA VINCI XI ARM (DISPOSABLE) ×12
DRAPE DA VINCI XI COLUMN (DISPOSABLE) ×3
DRAPE SURG IRRIG POUCH 19X23 (DRAPES) ×3 IMPLANT
DRSG TEGADERM 4X4.75 (GAUZE/BANDAGES/DRESSINGS) ×3 IMPLANT
ELECT PENCIL ROCKER SW 15FT (MISCELLANEOUS) ×3 IMPLANT
ELECT REM PT RETURN 15FT ADLT (MISCELLANEOUS) ×3 IMPLANT
GAUZE 4X4 16PLY ~~LOC~~+RFID DBL (SPONGE) IMPLANT
GAUZE SPONGE 2X2 8PLY STRL LF (GAUZE/BANDAGES/DRESSINGS) IMPLANT
GAUZE SPONGE 4X4 12PLY STRL (GAUZE/BANDAGES/DRESSINGS) ×3 IMPLANT
GLOVE BIO SURGEON STRL SZ 6.5 (GLOVE) ×3 IMPLANT
GLOVE SURG LX 7.5 STRW (GLOVE) ×2
GLOVE SURG LX STRL 7.5 STRW (GLOVE) ×4 IMPLANT
GOWN SRG XL LVL 4 BRTHBL STRL (GOWNS) ×2 IMPLANT
GOWN STRL NON-REIN XL LVL4 (GOWNS) ×3
GOWN STRL REUS W/ TWL XL LVL3 (GOWN DISPOSABLE) ×4 IMPLANT
GOWN STRL REUS W/TWL XL LVL3 (GOWN DISPOSABLE) ×6
HEMOSTAT ARISTA ABSORB 3G PWDR (HEMOSTASIS) ×1 IMPLANT
HOLDER FOLEY CATH W/STRAP (MISCELLANEOUS) ×3 IMPLANT
IRRIG SUCT STRYKERFLOW 2 WTIP (MISCELLANEOUS) ×3
IRRIGATION SUCT STRKRFLW 2 WTP (MISCELLANEOUS) ×2 IMPLANT
IV LACTATED RINGERS 1000ML (IV SOLUTION) ×3 IMPLANT
KIT TURNOVER KIT A (KITS) IMPLANT
PACK ROBOT UROLOGY CUSTOM (CUSTOM PROCEDURE TRAY) ×3 IMPLANT
PAD POSITIONING PINK XL (MISCELLANEOUS) ×3 IMPLANT
RELOAD STAPLE 45 4.1 GRN THCK (STAPLE) ×2 IMPLANT
SEAL CANN UNIV 5-8 DVNC XI (MISCELLANEOUS) ×8 IMPLANT
SEAL XI 5MM-8MM UNIVERSAL (MISCELLANEOUS) ×12
SET TUBE SMOKE EVAC HIGH FLOW (TUBING) ×3 IMPLANT
SOLUTION ELECTROLUBE (MISCELLANEOUS) ×3 IMPLANT
SPIKE FLUID TRANSFER (MISCELLANEOUS) ×3 IMPLANT
SPONGE GAUZE 2X2 STER 10/PKG (GAUZE/BANDAGES/DRESSINGS)
STAPLE RELOAD 45 GRN (STAPLE) ×2 IMPLANT
STAPLE RELOAD 45MM GREEN (STAPLE) ×3
SUT ETHILON 3 0 PS 1 (SUTURE) ×3 IMPLANT
SUT MNCRL AB 4-0 PS2 18 (SUTURE) ×6 IMPLANT
SUT V-LOC BARB 180 2/0GR6 GS22 (SUTURE) ×3
SUT VIC AB 0 CT1 27 (SUTURE) ×6
SUT VIC AB 0 CT1 27XBRD ANTBC (SUTURE) ×2 IMPLANT
SUT VICRYL 0 UR6 27IN ABS (SUTURE) ×6 IMPLANT
SUT VLOC BARB 180 ABS3/0GR12 (SUTURE) ×6
SUTURE V-LC BRB 180 2/0GR6GS22 (SUTURE) ×2 IMPLANT
SUTURE VLOC BRB 180 ABS3/0GR12 (SUTURE) ×4 IMPLANT
TOWEL OR NON WOVEN STRL DISP B (DISPOSABLE) ×3 IMPLANT
TROCAR Z-THREAD FIOS 5X100MM (TROCAR) IMPLANT
WATER STERILE IRR 1000ML POUR (IV SOLUTION) ×3 IMPLANT

## 2022-05-17 NOTE — Transfer of Care (Signed)
Immediate Anesthesia Transfer of Care Note  Patient: Jim Warner  Procedure(s) Performed: XI ROBOTIC ASSISTED LAPAROSCOPIC RADICAL PROSTATECTOMY (Abdomen) BILATERAL PELVIC LYMPH NODE DISSECTION (Bilateral: Abdomen)  Patient Location: PACU  Anesthesia Type:General  Level of Consciousness: drowsy and patient cooperative  Airway & Oxygen Therapy: Patient Spontanous Breathing and Patient connected to face mask oxygen  Post-op Assessment: Report given to RN and Post -op Vital signs reviewed and stable  Post vital signs: Reviewed and stable  Last Vitals:  Vitals Value Taken Time  BP 147/96 05/17/22 1549  Temp    Pulse 68 05/17/22 1552  Resp 14   SpO2 97 % 05/17/22 1552  Vitals shown include unvalidated device data.  Last Pain:  Vitals:   05/17/22 1028  TempSrc:   PainSc: 0-No pain         Complications: No notable events documented.

## 2022-05-17 NOTE — Discharge Instructions (Signed)

## 2022-05-17 NOTE — Discharge Summary (Signed)
Date of admission: 05/17/2022  Date of discharge: 05/18/2022  Admission diagnosis: Prostate Cancer   Discharge diagnosis: Prostate Cancer   Secondary diagnoses:  Patient Active Problem List   Diagnosis Date Noted   Prostate cancer ([place]) 05/17/2022    Procedures performed: Procedure(s): XI ROBOTIC ASSISTED LAPAROSCOPIC RADICAL PROSTATECTOMY BILATERAL PELVIC LYMPH NODE DISSECTION  History and Physical: For full details, please see admission history and physical. Briefly, Jim Warner is a 56 y.o. year old patient with history of prostate cancer who presents for robotic assisted radical prostatectomy with bilateral pelvic lymph node dissection.   Hospital Course: Patient tolerated the procedure well.  He was then transferred to the floor after an uneventful PACU stay.  His hospital course was uncomplicated.  On POD1 his morning labs were stable. His diet was slowly advanced and he had no nausea or vomiting on a regular diet. Pain was well controlled. His JP output was minimal so JP drain was removed prior to discharge. On POD#1 he had met discharge criteria: was eating a regular diet, was up and ambulating independently,  pain was well controlled, catheter was draining without issues and was ready to for discharge.  PE on day of discharge  Intake/Output Summary (Last 24 hours) at 05/18/2022 1001 Last data filed at 05/18/2022 0809 Gross per 24 hour  Intake 2882.28 ml  Output 2960 ml  Net -77.72 ml   Today's Vitals   05/18/22 0615 05/18/22 0628 05/18/22 0846 05/18/22 0933  BP:    117/78  Pulse:    (!) 54  Resp:    18  Temp:    98.2 F (36.8 C)  TempSrc:    Oral  SpO2:    91%  Weight:      Height:      PainSc: 4  4  8      Body mass index is 23.62 kg/m. NAD Non-labored breathing Abdomen is soft, incisions c/d/I, catheter draining clear yellow urine Ext symmetric   Laboratory values:  Recent Labs    05/17/22 1836 05/18/22 0416  HGB 14.6 13.9  HCT 45.0 41.8   Recent  Labs    05/18/22 0416  NA 136  K 4.2  CL 103  CO2 26  GLUCOSE 132*  BUN 10  CREATININE 0.80  CALCIUM 8.5*   No results for input(s): LABPT, INR in the last 72 hours. No results for input(s): LABURIN in the last 72 hours. Results for orders placed or performed during the hospital encounter of 05/10/22  Urine Culture     Status: None   Collection Time: 05/10/22  3:32 PM   Specimen: Urine, Clean Catch  Result Value Ref Range Status   Specimen Description   Final    URINE, CLEAN CATCH Performed at [facility], [place] [address] [place], [place] [address]    Special Requests   Final    NONE Performed at [facility], [place] [address] [place], [place] [address]    Culture   Final    NO GROWTH Performed at [facility], [place] [address] [place], [place] [address]    Report Status 05/11/2022 FINAL  Final    Disposition: Home  Discharge instruction: The patient was instructed to be ambulatory but told to refrain from heavy lifting, strenuous activity, or driving.   Discharge medications:  Allergies as of 05/18/2022   No Known Allergies      Medication List     STOP taking these medications    ibuprofen 200  MG tablet Commonly known as: ADVIL       TAKE these medications    cetirizine 10 MG tablet Commonly known as: ZYRTEC Take 10 mg by mouth daily as needed for allergies.   Clobetasol Propionate 0.05 % shampoo Apply 1 application. topically daily as needed (scalp psoriasis).   docusate sodium 100 MG capsule Commonly known as: COLACE Take 1 capsule (100 mg total) by mouth 2 (two) times daily.   sulfamethoxazole-trimethoprim 800-160 MG tablet Commonly known as: BACTRIM DS Take 1 tablet by mouth 2 (two) times daily. Start the day prior to foley removal appointment   traMADol 50 MG tablet Commonly known as: Ultram Take 1-2 tablets (50-100 mg total) by mouth every 6 (six) hours as needed for moderate pain or severe  pain.   Vtama 1 % Crea Generic drug: Tapinarof Apply 1 application. topically daily as needed (psoriasis).        Followup:   Follow-up Information     Karen Kays, NP Follow up on 05/24/2022.   Specialty: Nurse Practitioner Why: at 9:15 Contact information: [address] [place] [address] [phone]

## 2022-05-17 NOTE — Plan of Care (Signed)
  Problem: Education: Goal: Knowledge of the procedure and recovery process will improve Outcome: Progressing   Problem: Pain Management: Goal: General experience of comfort will improve Outcome: Progressing   Problem: Skin Integrity: Goal: Demonstration of wound healing without infection will improve Outcome: Progressing

## 2022-05-17 NOTE — H&P (Signed)
56 year old male presents today for discussion of his prostate cancer. The patient underwent a prostate biopsy on 02/03/2022. 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   Prostate cancer profile  Stage: T1c  PSA: 7.55  Biopsy 02/03/2022, 2/12 cores positive: 3+3 = 6 (5%) left medial apex, 3+4 equal 7 (30%) right lateral base  Prostate volume: 45gm   Prostate cancer nomogram after radical prostatectomy:  OC- 74 percent  ECE-25%  SVI-2%  LNI -3%  PFS (surgery)- 7.55 84% at 5 years, 74% 10 years   The patient has minimal voiding symptoms and normal erectile function.   The patient is a high Agricultural engineer. He is married. He has no past surgical history.    Interval: Today the patient presents to discuss surgery.     ALLERGIES: No Allergies    MEDICATIONS: No Medications    GU PSH: Prostate Needle Biopsy - 02/03/2022     NON-GU PSH: Surgical Pathology, Gross And Microscopic Examination For Prostate Needle - 02/03/2022     GU PMH: Prostate Cancer - 02/10/2022 Elevated PSA - 02/03/2022, - 12/29/2021, - 11/17/2021, - 04/29/2020 Nocturia - 12/29/2021, - 04/29/2020 BPH w/LUTS - 11/17/2021 Urinary Frequency - 11/17/2021, - 04/29/2020    NON-GU PMH: No Non-GU PMH    FAMILY HISTORY: No Family History    SOCIAL HISTORY: No Social History    REVIEW OF SYSTEMS:    GU Review Male:   Patient denies frequent urination, hard to postpone urination, burning/ pain with urination, get up at night to urinate, leakage of urine, stream starts and stops, trouble starting your stream, have to strain to urinate , erection problems, and penile pain.  Gastrointestinal (Upper):   Patient denies nausea, vomiting, and indigestion/ heartburn.  Gastrointestinal (Lower):   Patient denies diarrhea and constipation.  Constitutional:   Patient denies fever, night sweats, weight loss, and fatigue.  Skin:   Patient denies skin rash/ lesion and itching.  Eyes:    Patient denies blurred vision and double vision.  Ears/ Nose/ Throat:   Patient denies sore throat and sinus problems.  Hematologic/Lymphatic:   Patient denies swollen glands and easy bruising.  Cardiovascular:   Patient denies leg swelling and chest pains.  Respiratory:   Patient denies cough and shortness of breath.  Endocrine:   Patient denies excessive thirst.  Musculoskeletal:   Patient denies back pain and joint pain.  Neurological:   Patient denies headaches and dizziness.  Psychologic:   Patient denies depression and anxiety.   VITAL SIGNS:      03/23/2022 03:23 PM  BP 118/79 mmHg  Pulse 67 /min  Temperature 97.7 F / 36.5 C   MULTI-SYSTEM PHYSICAL EXAMINATION:    Respiratory: Normal breath sounds. No labored breathing, no use of accessory muscles.   Cardiovascular: Regular rate and rhythm. No murmur, no gallop. Normal temperature, normal extremity pulses, no swelling, no varicosities.      Complexity of Data:  Source Of History:  Patient  Lab Test Review:   PSA  Records Review:   Pathology Reports, Previous Doctor Records, Previous Patient Records, POC Tool  Urine Test Review:   Urinalysis   12/22/21  PSA  Total PSA 7.55 ng/mL    PROCEDURES:          Urinalysis Dipstick Dipstick Cont'd  Color: Yellow Bilirubin: Neg mg/dL  Appearance: Clear Ketones: Neg mg/dL  Specific Gravity: 1.025 Blood: Neg ery/uL  pH: 5.5 Protein: Neg mg/dL  Glucose: Neg mg/dL Urobilinogen: 0.2 mg/dL  Nitrites: Neg    Leukocyte Esterase: Neg leu/uL    ASSESSMENT:      ICD-10 Details  1 GU:   Prostate Cancer - C61    PLAN:           Schedule Return Visit/Planned Activity: ASAP - PT Referral             Note: Pre-op RALP          Document Letter(s):  Created for Patient: Clinical Summary    We discussed prostatectomy and specifically robotic prostatectomy with bilateral pelvic lymphadenectomy. I showed the patient on their abdomen the approximately 6 small incision (trocar) sites  as well as presumed extraction sites with robotic approach as well as possible open incision sites should open conversion be necessary. We discussed peri-operative risks including bleeding, infection, deep vein thrombosis, pulmonary embolism, compartment syndrome, neuropathy / neuropraxia, heart attack, stroke, death, as well as long-term risks such as non-cure / need for additional therapy. We specifically addressed that the procedure would compromise urinary control leading to stress incontinence which typically resolves with time and pelvic rehabilitation (Kegel's, etc..), but can sometimes be permanent and require additional therapy including surgery. We also specifically addressed sexual side-effects including significant erectile dysfunction which typically partially resolves with time but can also be permanent and require additional therapy including surgery.   We discussed the typical hospital course including usual 1-2 night hospitalization, discharge with foley catheter in place usually for 1-2 weeks before voiding trial as well as usually 2 week recovery until able to perform most non-strenuous activity and 6 weeks until able to return to most jobs and more strenuous activity such as exercise.         Notes:   Plan to perform bilateral nerve sparing radical prostatectomy with pelvic lymph node dissection.

## 2022-05-17 NOTE — Op Note (Signed)
Preoperative diagnosis:  Prostate Cancer   Postoperative diagnosis:  same   Procedure: Robotic assisted laparoscopic radical prostatectomy Bilateral pelvic lymph node dissection  Surgeon: Ardis Hughs, MD First Assistant: Debbrah Alar, PA Resident surgeon: Aldine Contes, MD  Anesthesia: General  Complications: None  Intraoperative findings:  Minimal tissue in obturator fossa - node tissue taken bilaterally Bilateral nerve spare  EBL: 250cc  Specimens:  #1.  Prostate and seminal vesicals #2.  Bilateral pelvic lymph nodes  Indication: Jim Warner is a 56 y.o. patient with prostate cancer.  After reviewing the management options for treatment, he elected to proceed with the removal of his prostate. We have discussed the potential benefits and risks of the procedure, side effects of the proposed treatment, the likelihood of the patient achieving the goals of the procedure, and any potential problems that might occur during the procedure or recuperation. Informed consent has been obtained.  Description of procedure:  The patient was consented in the preoperative holding area. He was in brought back to the operating room placed the table in supine position. General anesthesia was then induced and endotracheal tube was inserted. He was then placed in dorsolithotomy position and placed in steep Trendelenburg. He was then prepped and draped in the routine sterile fashion. We, the first assistant and I, then began by making a 10 mm incision supraumbilical midline incision the skin down through into the peritoneum. Then placed a 8 mm trocar. I then inflated the abdomen and inserted the 0 robotic lens. We then placed 2 additional a 8 millimeter trochars in the patient's left lower abdomen proximally 9 cm apart and 2 trochars on the patient's right lower abdomen, one was an 8 mm trocar and the one most lateral was a 12 mm trocar which was used as the assistant port. A 5 mm trocar was  placed by triangulating the 2 right lateral ports as a second assistant port. These ports were all placed under visual guidance. Once the ports were noted to be satisfactory position the robot was docked. We started with the 0 lens, monopolar scissors in the right hand and the Wisconsin forceps the left hand as well as a fenestrated grasper as the third arm on the left-hand side.   We, the first assistant and I,  began our dissection the posterior plane incising the peritoneum at the level of the vas deferens. Isolated the left vas deferens and dissected it proximally towards the spermatic cord for 5 cm prior to ligating it. Then used this as traction to isolate the left the seminal vesicle which was then undressed bluntly and completely dissected out, all vessels were cauterized with a combination of bipolar and the monopolar scissors. We then turned our attention to the right side and similarly dissected out the right vas deferens and seminal vesicle. Once the SVs had been freed, we turned our attention to the posterior plane and bluntly dissected the tissue between the rectum and the posterior wall of the prostate bluntly out towards the apex.    At this point the bladder was taken down starting at the urachal remnant with a combination of both blunt dissection and sharp dissection using monopolar cautery the bladder was dropped down in the usual fashion to the medial umbilical ligaments laterally and the dorsal vein of the prostate anteriorly creating our space of Retzius. We then turned our attention to the endopelvic fascia which was incised laterally starting on the patient's right-hand side the levator muscles were pushed off the prostate  laterally up towards the dorsal vein complex on the right-hand side. This process was then repeated on the left-hand side and a nice notch was created for the dorsal vein. I then used a 65m stapler to staple the dorsal vein.   We,the first assistant and I, then  located the bladder neck at the vesicoprostatic junction and using the monopolar scissors dissected down through the perivesical tissues and the bladder neck down to the prostatic urethra. The catheter was then deflated and pulled through our urethral opening and then used to retract the prostate anteriorly for the posterior bladder neck dissection. Once through the bladder neck and into the posterior plane of the prostate, the SVs were brought through the opening. The left pedicle was then isolated and systematically ligated with Weck clips and scissors. The nerve bundle was then peeled off the posterior lateral aspect of the prostate and bluntly dissected away off the prostate. This was then repeated on the right side.    I then came down through the dorsal venous complex anteriorly down to the membranous urethra using the monopolar. Once down to the urethra, it was transected sharply and the apex of the prostate was then dissected off the levator and rectourethralis muscles. Once the apex of the prostate had been dissected free we came back to the base of the prostate and bluntly push the rectum and nerve vascular bundle off the prostate the patient's left and used clips on the patient's right to free the prostate. Once the prostate was free it was placed off to the side. The pelvis was then irrigated with normal saline and noted to be relatively hemostatic.  Attention was then turned to the right pelvic sidewall. The fibrofatty tissue between the external iliac vein, confluence of the iliac vessels, hypogastric artery, and Cooper's ligament was dissected free from the pelvic sidewall with care to preserve the obturator nerve. Weck clips were used for lymphostasis and hemostasis. An identical procedure was performed on the contralateral side and the lymphatic packets were removed for permanent pathologic analysis.  The prostate and both lymph node tissues were placed in the Endo Catch bag and the string  brought to the 5 mm port.    The vesicourethral anastomosis was then completed with 2 interlocking 3-0 V. lock sutures running the anastomosis in the 6:00 position to the 12:00 position on each side and then tying it off on the top. The final catheter was then passed through the patient's urethra and into the bladder and 120 cc was instilled into the bladder to test the anastomosis. As there was no leak a 139[place] drain was passed through the left lateral port and placed around the vesicourethral anastomosis. A 12 mm assistant port on the right lateral side was then closed with 0 Vicryl with the help of the CLeggett [employer] Platt[employer]le. The 12 mm midline infraumbilical incision was then extended another centimeter taken down and the fascia opened to remove the Endo Catch bag with the prostate specimen. The fascia was then closed with a 0 Vicryl and all skin ports were closed with 4-0 Monocryl in a subcutaneous fashion. Dermabond glue was then applied to the incisions. The drain was then secured to the skin with a 0 nylon stitch and dressing applied.   At the end of the case all laps needles and sponges had been accounted for. There no immediate complications. The patient returned to the PACU in stable condition.

## 2022-05-17 NOTE — Interval H&P Note (Signed)
History and Physical Interval Note:  05/17/2022 11:45 AM  Jim Warner  has presented today for surgery, with the diagnosis of PROSTATE CANCER.  The various methods of treatment have been discussed with the patient and family. After consideration of risks, benefits and other options for treatment, the patient has consented to  Procedure(s): XI ROBOTIC ASSISTED LAPAROSCOPIC RADICAL PROSTATECTOMY (N/A) BILATERAL PELVIC LYMPH NODE DISSECTION (Bilateral) as a surgical intervention.  The patient's history has been reviewed, patient examined, no change in status, stable for surgery.  I have reviewed the patient's chart and labs.  Questions were answered to the patient's satisfaction.     Ardis Hughs

## 2022-05-17 NOTE — Anesthesia Procedure Notes (Signed)
Procedure Name: Intubation Date/Time: 05/17/2022 12:28 PM Performed by: Claudia Desanctis, CRNA Pre-anesthesia Checklist: Patient identified, Emergency Drugs available, Suction available and Patient being monitored Patient Re-evaluated:Patient Re-evaluated prior to induction Oxygen Delivery Method: Circle system utilized Preoxygenation: Pre-oxygenation with 100% oxygen Induction Type: IV induction Ventilation: Mask ventilation without difficulty Laryngoscope Size: 2 and Miller Grade View: Grade I Tube type: Oral Tube size: 7.5 mm Number of attempts: 1 Airway Equipment and Method: Stylet Placement Confirmation: ETT inserted through vocal cords under direct vision, positive ETCO2 and breath sounds checked- equal and bilateral Tube secured with: Tape Dental Injury: Teeth and Oropharynx as per pre-operative assessment

## 2022-05-18 ENCOUNTER — Encounter (HOSPITAL_COMMUNITY): Payer: Self-pay | Admitting: Urology

## 2022-05-18 DIAGNOSIS — C61 Malignant neoplasm of prostate: Secondary | ICD-10-CM | POA: Diagnosis not present

## 2022-05-18 LAB — BASIC METABOLIC PANEL
Anion gap: 7 (ref 5–15)
BUN: 10 mg/dL (ref 6–20)
CO2: 26 mmol/L (ref 22–32)
Calcium: 8.5 mg/dL — ABNORMAL LOW (ref 8.9–10.3)
Chloride: 103 mmol/L (ref 98–111)
Creatinine, Ser: 0.8 mg/dL (ref 0.61–1.24)
GFR, Estimated: >60 mL/min (ref >60)
Glucose, Bld: 132 mg/dL — ABNORMAL HIGH (ref 70–99)
Potassium: 4.2 mmol/L (ref 3.5–5.1)
Sodium: 136 mmol/L (ref 135–145)

## 2022-05-18 LAB — HEMOGLOBIN AND HEMATOCRIT, BLOOD
HCT: 41.8 % (ref 39.0–52.0)
Hemoglobin: 13.9 g/dL (ref 13.0–17.0)

## 2022-05-18 MED ORDER — KETOROLAC TROMETHAMINE 15 MG/ML IJ SOLN
15.0000 mg | Freq: Four times a day (QID) | INTRAMUSCULAR | Status: DC
  Administered 2022-05-18: 15 mg via INTRAVENOUS
  Filled 2022-05-18: qty 1

## 2022-05-18 NOTE — TOC Transition Note (Signed)
Transition of Care [facility]) - CM/SW Discharge Note   Patient Details  Name: Marshaun Lortie MRN: [id number] Date of Birth: [birth year]  Transition of Care [facility]) CM/SW Contact:  Leeroy Cha, RN Phone Number: 05/18/2022, 10:11 AM   Clinical Narrative:    Dcd to home with no toc needs   Final next level of care: Home/Self Care Barriers to Discharge: No Barriers Identified   Patient Goals and CMS Choice Patient states their goals for this hospitalization and ongoing recovery are:: to go home CMS Medicare.gov Compare Post Acute Care list provided to:: Patient    Discharge Placement                       Discharge Plan and Services   Discharge Planning Services: CM Consult                                 Social Determinants of Health (SDOH) Interventions     Readmission Risk Interventions     View : No data to display.

## 2022-05-18 NOTE — Plan of Care (Signed)
Discharge instructions reviewed with patient and wife, questions answered, verbalized understanding.  Patient switched to leg bag and patient and wife educated on use of leg bag, standard drainage bag and foley care.  Patient verbalizes understanding that he is to take prescribed antibiotic the day prior to return office visit for foley removal.    Patient transported to main entrance via wheelchair to be taken home by wife and daughter.

## 2022-05-18 NOTE — Anesthesia Postprocedure Evaluation (Signed)
Anesthesia Post Note  Patient: Rosanne Ashing  Procedure(s) Performed: XI ROBOTIC ASSISTED LAPAROSCOPIC RADICAL PROSTATECTOMY (Abdomen) BILATERAL PELVIC LYMPH NODE DISSECTION (Bilateral: Abdomen)     Patient location during evaluation: PACU Anesthesia Type: General Level of consciousness: awake and alert Pain management: pain level controlled Vital Signs Assessment: post-procedure vital signs reviewed and stable Respiratory status: spontaneous breathing, nonlabored ventilation, respiratory function stable and patient connected to nasal cannula oxygen Cardiovascular status: blood pressure returned to baseline and stable Postop Assessment: no apparent nausea or vomiting Anesthetic complications: no   No notable events documented.  Last Vitals:  Vitals:   05/18/22 0100 05/18/22 0512  BP: 117/81 121/76  Pulse: (!) 56 (!) 57  Resp: 19 18  Temp: 37 C 36.8 C  SpO2: 100% 95%    Last Pain:  Vitals:   05/18/22 0628  TempSrc:   PainSc: 4                  Barnet [place]

## 2022-05-18 NOTE — TOC Initial Note (Signed)
Transition of Care [facility]) - Initial/Assessment Note    Patient Details  Name: Jim Warner MRN: [id number] Date of Birth: 1966/01/06  Transition of Care [facility]) CM/SW Contact:    Leeroy Cha, RN Phone Number: 05/18/2022, 8:07 AM  Clinical Narrative:                  Transition of Care [facility]) Screening Note   Patient Details  Name: Jim Warner Date of Birth: 1966/04/01   Transition of Care [facility]) CM/SW Contact:    Leeroy Cha, RN Phone Number: 05/18/2022, 8:07 AM    Transition of Care Department [facility]) has reviewed patient and no TOC needs have been identified at this time. We will continue to monitor patient advancement through interdisciplinary progression rounds. If new patient transition needs arise, please place a TOC consult.    Expected Discharge Plan: Home/Self Care Barriers to Discharge: No Barriers Identified   Patient Goals and CMS Choice Patient states their goals for this hospitalization and ongoing recovery are:: to go home CMS Medicare.gov Compare Post Acute Care list provided to:: Patient    Expected Discharge Plan and Services Expected Discharge Plan: Home/Self Care   Discharge Planning Services: CM Consult   Living arrangements for the past 2 months: Single Family Home                                      Prior Living Arrangements/Services Living arrangements for the past 2 months: Single Family Home Lives with:: Spouse Patient language and need for interpreter reviewed:: Yes Do you feel safe going back to the place where you live?: Yes            Criminal Activity/Legal Involvement Pertinent to Current Situation/Hospitalization: No - Comment as needed  Activities of Daily Living Home Assistive Devices/Equipment: None ADL Screening (condition at time of admission) Patient's cognitive ability adequate to safely complete daily activities?: Yes Is the patient deaf or have difficulty hearing?: No Does the patient have  difficulty seeing, even when wearing glasses/contacts?: No Does the patient have difficulty concentrating, remembering, or making decisions?: No Patient able to express need for assistance with ADLs?: No Does the patient have difficulty dressing or bathing?: No Independently performs ADLs?: No Communication: Independent Dressing (OT): Independent Grooming: Independent Feeding: Independent Bathing: Independent Toileting: Independent In/Out Bed: Independent Walks in Home: Independent Does the patient have difficulty walking or climbing stairs?: No Weakness of Legs: None Weakness of Arms/Hands: None  Permission Sought/Granted                  Emotional Assessment Appearance:: Appears stated age     Orientation: : Oriented to Self, Oriented to Place, Oriented to  Time, Oriented to Situation Alcohol / Substance Use: Not Applicable Psych Involvement: No (comment)  Admission diagnosis:  Prostate cancer [facility]) [C61] Patient Active Problem List   Diagnosis Date Noted   Prostate cancer ([facility] [place]) 05/17/2022   PCP:  Vernie Shanks, MD Pharmacy:   [id number] [place], [place] [id number]: [phone]: [phone]    Social Determinants of Health (SDOH) Interventions    Readmission Risk Interventions     View : No data to display.

## 2022-05-22 ENCOUNTER — Other Ambulatory Visit (HOSPITAL_COMMUNITY): Payer: Self-pay

## 2022-05-22 LAB — SURGICAL PATHOLOGY

## 2022-08-18 ENCOUNTER — Other Ambulatory Visit: Payer: Self-pay | Admitting: Home Modifications

## 2022-08-18 DIAGNOSIS — Z122 Encounter for screening for malignant neoplasm of respiratory organs: Secondary | ICD-10-CM

## 2022-09-13 ENCOUNTER — Ambulatory Visit
Admission: RE | Admit: 2022-09-13 | Discharge: 2022-09-13 | Disposition: A | Discharge status: Home / Self Care | Payer: No Typology Code available for payment source | Source: Ambulatory Visit | Attending: Home Modifications | Admitting: Home Modifications

## 2022-09-13 DIAGNOSIS — Z122 Encounter for screening for malignant neoplasm of respiratory organs: Secondary | ICD-10-CM

## 2022-11-16 ENCOUNTER — Other Ambulatory Visit: Payer: Self-pay | Admitting: Family Medicine

## 2022-11-16 DIAGNOSIS — Z122 Encounter for screening for malignant neoplasm of respiratory organs: Secondary | ICD-10-CM

## 2022-11-22 ENCOUNTER — Emergency Department (HOSPITAL_BASED_OUTPATIENT_CLINIC_OR_DEPARTMENT_OTHER): Payer: No Typology Code available for payment source

## 2022-11-22 ENCOUNTER — Encounter (HOSPITAL_BASED_OUTPATIENT_CLINIC_OR_DEPARTMENT_OTHER): Payer: Self-pay

## 2022-11-22 ENCOUNTER — Observation Stay (HOSPITAL_BASED_OUTPATIENT_CLINIC_OR_DEPARTMENT_OTHER)
Admission: EM | Admit: 2022-11-22 | Discharge: 2022-11-25 | Disposition: A | Discharge status: Home / Self Care | Payer: No Typology Code available for payment source | Attending: General Surgery | Admitting: General Surgery

## 2022-11-22 ENCOUNTER — Other Ambulatory Visit: Payer: Self-pay

## 2022-11-22 DIAGNOSIS — K810 Acute cholecystitis: Secondary | ICD-10-CM

## 2022-11-22 DIAGNOSIS — K8000 Calculus of gallbladder with acute cholecystitis without obstruction: Principal | ICD-10-CM | POA: Insufficient documentation

## 2022-11-22 DIAGNOSIS — R112 Nausea with vomiting, unspecified: Secondary | ICD-10-CM | POA: Diagnosis not present

## 2022-11-22 DIAGNOSIS — R1084 Generalized abdominal pain: Secondary | ICD-10-CM

## 2022-11-22 DIAGNOSIS — R1011 Right upper quadrant pain: Secondary | ICD-10-CM

## 2022-11-22 DIAGNOSIS — Z8546 Personal history of malignant neoplasm of prostate: Secondary | ICD-10-CM | POA: Insufficient documentation

## 2022-11-22 DIAGNOSIS — Z87891 Personal history of nicotine dependence: Secondary | ICD-10-CM | POA: Diagnosis not present

## 2022-11-22 DIAGNOSIS — K819 Cholecystitis, unspecified: Secondary | ICD-10-CM

## 2022-11-22 LAB — CBC
HCT: 45.3 % (ref 39.0–52.0)
Hemoglobin: 15.2 g/dL (ref 13.0–17.0)
MCH: 31.3 pg (ref 26.0–34.0)
MCHC: 33.6 g/dL (ref 30.0–36.0)
MCV: 93.2 fL (ref 80.0–100.0)
Platelets: 250 10*3/uL (ref 150–400)
RBC: 4.86 MIL/uL (ref 4.22–5.81)
RDW: 12.4 % (ref 11.5–15.5)
WBC: 6.1 10*3/uL (ref 4.0–10.5)
nRBC: 0 % (ref 0.0–0.2)

## 2022-11-22 LAB — COMPREHENSIVE METABOLIC PANEL
ALT: 22 U/L (ref 0–44)
AST: 17 U/L (ref 15–41)
Albumin: 4.8 g/dL (ref 3.5–5.0)
Alkaline Phosphatase: 72 U/L (ref 38–126)
Anion gap: 9 (ref 5–15)
BUN: 8 mg/dL (ref 6–20)
CO2: 30 mmol/L (ref 22–32)
Calcium: 9.6 mg/dL (ref 8.9–10.3)
Chloride: 100 mmol/L (ref 98–111)
Creatinine, Ser: 0.93 mg/dL (ref 0.61–1.24)
GFR, Estimated: >60 mL/min (ref >60)
Glucose, Bld: 98 mg/dL (ref 70–99)
Potassium: 3.9 mmol/L (ref 3.5–5.1)
Sodium: 139 mmol/L (ref 135–145)
Total Bilirubin: 0.4 mg/dL (ref 0.3–1.2)
Total Protein: 7.6 g/dL (ref 6.5–8.1)

## 2022-11-22 LAB — URINALYSIS, ROUTINE W REFLEX MICROSCOPIC
Bilirubin Urine: NEGATIVE
Glucose, UA: NEGATIVE mg/dL
Hgb urine dipstick: NEGATIVE
Ketones, ur: NEGATIVE mg/dL
Leukocytes,Ua: NEGATIVE
Nitrite: NEGATIVE
Protein, ur: NEGATIVE mg/dL
Specific Gravity, Urine: 1.019 (ref 1.005–1.030)
pH: 6.5 (ref 5.0–8.0)

## 2022-11-22 LAB — LIPASE, BLOOD: Lipase: 32 U/L (ref 11–51)

## 2022-11-22 MED ORDER — FENTANYL CITRATE PF 50 MCG/ML IJ SOSY
50.0000 ug | PREFILLED_SYRINGE | Freq: Once | INTRAMUSCULAR | Status: AC
  Administered 2022-11-22: 50 ug via INTRAVENOUS
  Filled 2022-11-22: qty 1

## 2022-11-22 MED ORDER — LACTATED RINGERS IV BOLUS
1000.0000 mL | Freq: Once | INTRAVENOUS | Status: AC
  Administered 2022-11-22: 1000 mL via INTRAVENOUS

## 2022-11-22 MED ORDER — ONDANSETRON HCL 4 MG/2ML IJ SOLN
4.0000 mg | Freq: Once | INTRAMUSCULAR | Status: AC
  Administered 2022-11-22: 4 mg via INTRAVENOUS
  Filled 2022-11-22: qty 2

## 2022-11-22 MED ORDER — ONDANSETRON HCL 4 MG/2ML IJ SOLN
INTRAMUSCULAR | Status: AC
  Administered 2022-11-23: 4 mg via INTRAVENOUS
  Filled 2022-11-22: qty 2

## 2022-11-22 MED ORDER — IOHEXOL 300 MG/ML  SOLN
100.0000 mL | Freq: Once | INTRAMUSCULAR | Status: AC | PRN
  Administered 2022-11-22: 80 mL via INTRAVENOUS

## 2022-11-22 NOTE — Discharge Instructions (Addendum)
[facility]  Please arrive at least 30 min before your appointment to complete your check in paperwork.  If you are unable to arrive 30 min prior to your appointment time we may have to cancel or reschedule you. LAPAROSCOPIC SURGERY: POST OP INSTRUCTIONS Always review your discharge instruction sheet given to you by the facility where your surgery was performed. IF YOU HAVE DISABILITY OR FAMILY LEAVE FORMS, YOU MUST BRING THEM TO THE OFFICE FOR PROCESSING.   DO NOT GIVE THEM TO YOUR DOCTOR.  PAIN CONTROL  First take acetaminophen (Tylenol) AND/or ibuprofen (Advil) to control your pain after surgery.  Follow directions on package.  Taking acetaminophen (Tylenol) and/or ibuprofen (Advil) regularly after surgery will help to control your pain and lower the amount of prescription pain medication you may need.  You should not take more than 4,000 mg (4 grams) of acetaminophen (Tylenol) in 24 hours.  You should not take ibuprofen (Advil), aleve, motrin, naprosyn or other NSAIDS if you have a history of stomach ulcers or chronic kidney disease.  A prescription for pain medication may be given to you upon discharge.  Take your pain medication as prescribed, if you still have uncontrolled pain after taking acetaminophen (Tylenol) or ibuprofen (Advil). Use ice packs to help control pain. If you need a refill on your pain medication, please contact your pharmacy.  They will contact our office to request authorization. Prescriptions will not be filled after 5pm or on week-ends.  HOME MEDICATIONS Take your usually prescribed medications unless otherwise directed.  DIET You should follow a light diet the first few days after arrival home.  Be sure to include lots of fluids daily. Avoid fatty, fried foods.   CONSTIPATION It is common to experience some constipation after surgery and if you are taking pain medication.  Increasing fluid intake and taking a stool softener (such as Colace)  will usually help or prevent this problem from occurring.  A mild laxative (Milk of Magnesia or Miralax) should be taken according to package instructions if there are no bowel movements after 48 hours.  WOUND/INCISION CARE Most patients will experience some swelling and bruising in the area of the incisions.  Ice packs will help.  Swelling and bruising can take several days to resolve.  Unless discharge instructions indicate otherwise, follow guidelines below  STERI-STRIPS - you may remove your outer bandages 48 hours after surgery, and you may shower at that time.  You have steri-strips (small skin tapes) in place directly over the incision.  These strips should be left on the skin for 7-10 days.   DERMABOND/SKIN GLUE - you may shower in 24 hours.  The glue will flake off over the next 2-3 weeks. Any sutures or staples will be removed at the office during your follow-up visit.  ACTIVITIES You may resume regular (light) daily activities beginning the next day--such as daily self-care, walking, climbing stairs--gradually increasing activities as tolerated.  You may have sexual intercourse when it is comfortable.  Refrain from any heavy lifting or straining until approved by your doctor. You may drive when you are no longer taking prescription pain medication, you can comfortably wear a seatbelt, and you can safely maneuver your car and apply brakes.  FOLLOW-UP You should see your doctor in the office for a follow-up appointment approximately 2-3 weeks after your surgery.  You should have been given your post-op/follow-up appointment when your surgery was scheduled.  If you did not receive a post-op/follow-up appointment, make sure   that you call for this appointment within a day or two after you arrive home to insure a convenient appointment time.   WHEN TO CALL YOUR DOCTOR: Fever over 101.0 Inability to urinate Continued bleeding from incision. Increased pain, redness, or drainage from the  incision. Increasing abdominal pain  The clinic staff is available to answer your questions during regular business hours.  Please don't hesitate to call and ask to speak to one of the nurses for clinical concerns.  If you have a medical emergency, go to the nearest emergency room or call 911.  A surgeon from [facility] is always on call at the hospital. [address], [address], [place], [place]  [address] ? [address] [address], [place], [place]   [address] [phone] ? [phone] ? FAX [phone]  

## 2022-11-22 NOTE — ED Triage Notes (Signed)
Patient here POV from Home  Endorses Epigastric ABD Pain that began 2 Days ago. Worsened since.   Nausea. No Emesis. No Diarrhea.   NAD Noted during Triage. A&Ox4. GCS 15. Ambulatory.

## 2022-11-22 NOTE — ED Provider Notes (Signed)
[facility] EMERGENCY DEPT Provider Note   CSN: [id number] Arrival date & time: 11/22/22  1645     History  Chief Complaint  Patient presents with   Abdominal Pain    Jim Warner is a 56 y.o. male.   Abdominal Pain Associated symptoms: nausea and vomiting      56 year old male with medical history significant for robotic assisted upper scopic prostatectomy in May 2023 who presents to the emergency department with roughly 2 days of epigastric and generalized abdominal pain with associated nausea and retching.  The patient denies any fevers.  He denies any diarrhea.  His last bowel movement was a small on this morning.  He states that he is not passing gas.  He has been retching up small amounts of green bile.  Home Medications Prior to Admission medications   Medication Sig Start Date End Date Taking? Authorizing Provider  cetirizine (ZYRTEC) 10 MG tablet Take 10 mg by mouth daily as needed for allergies.    [provider]  Clobetasol Propionate 0.05 % shampoo Apply 1 application. topically daily as needed (scalp psoriasis). 03/20/22   [provider]  docusate sodium (COLACE) 100 MG capsule Take 1 capsule (100 mg total) by mouth 2 (two) times daily. 05/17/22   Debbrah Alar, PA-C  sulfamethoxazole-trimethoprim (BACTRIM DS) 800-160 MG tablet Take 1 tablet by mouth 2 (two) times daily. Start the day prior to foley removal appointment 05/17/22   Debbrah Alar, PA-C  Tapinarof (VTAMA) 1 % CREA Apply 1 application. topically daily as needed (psoriasis).    [provider]  traMADol (ULTRAM) 50 MG tablet Take 1-2 tablets (50-100 mg total) by mouth every 6 (six) hours as needed for moderate pain or severe pain. 05/17/22   Debbrah Alar, PA-C      Allergies    Patient has no known allergies.    Review of Systems   Review of Systems  Gastrointestinal:  Positive for abdominal pain, nausea and vomiting.  All other systems reviewed and are  negative.   Physical Exam Updated Vital Signs BP 132/68 (BP Location: Right Arm)   Pulse (!) 54   Temp 98.3 F (36.8 C) (Oral)   Resp 18   Ht _0  (1.854 m)   Wt 81.2 kg   SpO2 96%   BMI 23.62 kg/m  Physical Exam Vitals and nursing note reviewed.  Constitutional:      General: He is not in acute distress.    Appearance: He is well-developed.  HENT:     Head: Normocephalic and atraumatic.  Eyes:     Conjunctiva/sclera: Conjunctivae normal.  Cardiovascular:     Rate and Rhythm: Normal rate and regular rhythm.     Heart sounds: No murmur heard. Pulmonary:     Effort: Pulmonary effort is normal. No respiratory distress.     Breath sounds: Normal breath sounds.  Abdominal:     Palpations: Abdomen is soft.     Tenderness: There is generalized abdominal tenderness and tenderness in the right upper quadrant. There is guarding. There is no rebound.  Musculoskeletal:        General: No swelling.     Cervical back: Neck supple.  Skin:    General: Skin is warm and dry.     Capillary Refill: Capillary refill takes less than 2 seconds.  Neurological:     Mental Status: He is alert.  Psychiatric:        Mood and Affect: Mood normal.     ED Results /  Procedures / Treatments   Labs (all labs ordered are listed, but only abnormal results are displayed) Labs Reviewed  LIPASE, BLOOD  COMPREHENSIVE METABOLIC PANEL  CBC  URINALYSIS, ROUTINE W REFLEX MICROSCOPIC    EKG None  Radiology CT ABDOMEN PELVIS W CONTRAST  Result Date: 11/22/2022 CLINICAL DATA:  Abdominal pain, bowel obstruction suspected. Worsening epigastric abdominal pain with nausea. EXAM: CT ABDOMEN AND PELVIS WITH CONTRAST TECHNIQUE: Multidetector CT imaging of the abdomen and pelvis was performed using the standard protocol following bolus administration of intravenous contrast. RADIATION DOSE REDUCTION: This exam was performed according to the departmental dose-optimization program which includes automated  exposure control, adjustment of the mA and/or kV according to patient size and/or use of iterative reconstruction technique. CONTRAST:  53m OMNIPAQUE IOHEXOL 300 MG/ML  SOLN COMPARISON:  None Available. FINDINGS: Lower chest: Dependent atelectasis is present at the lung bases. Hepatobiliary: No focal abnormality in the liver. No biliary ductal dilatation. The gallbladder is without stones. There is suggestion of gallbladder wall thickening at proximally 4 mm. Mild fat stranding is noted at the gallbladder fossa. Pancreas: Unremarkable. No pancreatic ductal dilatation or surrounding inflammatory changes. Spleen: Normal in size without focal abnormality. Adrenals/Urinary Tract: No adrenal nodule or mass. The kidneys enhance symmetrically. Cysts are present in the right kidney. No renal calculus or hydronephrosis. The bladder is unremarkable. Stomach/Bowel: Stomach is within normal limits. Appendix appears normal. No evidence of bowel wall thickening, distention, or inflammatory changes. No free air or pneumatosis. Vascular/Lymphatic: Aortic atherosclerosis. No enlarged abdominal or pelvic lymph nodes. Reproductive: Prostate gland is not well seen. Other: No abdominopelvic ascites. Small fat containing umbilical hernia. Musculoskeletal: Degenerative changes are present in the thoracolumbar spine. No acute osseous abnormality. IMPRESSION: 1. Mild gallbladder wall thickening with fat stranding at the gallbladder fossa. No cholelithiasis is seen. Ultrasound is recommended for further evaluation. 2. Right renal cysts. 3. Aortic atherosclerosis. Electronically Signed   By: LBrett FairyM.D.   On: 11/22/2022 22:36    Procedures Procedures    Medications Ordered in ED Medications  lactated ringers bolus 1,000 mL (has no administration in time range)  fentaNYL (SUBLIMAZE) injection 50 mcg (50 mcg Intravenous Given 11/22/22 2206)  ondansetron (ZOFRAN) injection 4 mg (4 mg Intravenous Given 11/22/22 2205)  iohexol  (OMNIPAQUE) 300 MG/ML solution 100 mL (80 mLs Intravenous Contrast Given 11/22/22 2212)    ED Course/ Medical Decision Making/ A&P                           Medical Decision Making Amount and/or Complexity of Data Reviewed Labs: ordered. Radiology: ordered.  Risk Prescription drug management.     55year old male with medical history significant for robotic assisted upper scopic prostatectomy in May 2023 who presents to the emergency department with roughly 2 days of epigastric and generalized abdominal pain with associated nausea and retching.  The patient denies any fevers.  He denies any diarrhea.  His last bowel movement was a small on this morning.  He states that he is not passing gas.  He has been retching up small amounts of green bile.  Vitals and telemetry on arrival: Afebrile, bradycardic P53, not tachycardic, not tachypneic, hemodynamically stable, saturating 99% on room air.  Pertinent exam findings include: Neurolyse abdominal tenderness with some focality in the epigastrium and right upper quadrant  Differential Diagnosis: Likely cholelithiasis/cholecystitis or small bowel obstruction.  Also considered appendicitis, AAA, ACS, pneumonia, pneumothorax, Pyelonephritis, Nephrolithiasis, Pancreatitis, Shingles, Perforated Bowel or  Ulcer, Diverticulosis/itis, Ischemic Mesentery, Inflammatory Bowel Disease, Strangulated/Incarcerated Hernia, gastritis, PUD.   Lab results include: Lipase normal, CBC, CMP, urinalysis normal.  Imaging results include:  CT abdomen pelvis revealed the following: IMPRESSION:  1. Mild gallbladder wall thickening with fat stranding at the  gallbladder fossa. No cholelithiasis is seen. Ultrasound is  recommended for further evaluation.  2. Right renal cysts.  3. Aortic atherosclerosis.   RUQ [place]: Pending  Course of tx has consisted of: Patient administered IV fentanyl, IV Zofran, IV fluid bolus for pain control and nausea control.  Thought process:  Symptoms could be consistent with acute cholecystitis.  No evidence of bowel obstruction on CT imaging.  Signout given to Dr. Roxanne Mins pending right upper quadrant ultrasound, reassessment of the patient for symptomatic improvement.  Signout given at 2330.    Final Clinical Impression(s) / ED Diagnoses Final diagnoses:  Generalized abdominal pain  Nausea and vomiting, unspecified vomiting type    Rx / DC Orders ED Discharge Orders     None         Regan Lemming, MD 11/22/22 2309

## 2022-11-23 DIAGNOSIS — R112 Nausea with vomiting, unspecified: Secondary | ICD-10-CM | POA: Diagnosis not present

## 2022-11-23 DIAGNOSIS — R1011 Right upper quadrant pain: Secondary | ICD-10-CM | POA: Diagnosis present

## 2022-11-23 DIAGNOSIS — K810 Acute cholecystitis: Secondary | ICD-10-CM | POA: Diagnosis present

## 2022-11-23 DIAGNOSIS — Z87891 Personal history of nicotine dependence: Secondary | ICD-10-CM | POA: Diagnosis not present

## 2022-11-23 DIAGNOSIS — Z8546 Personal history of malignant neoplasm of prostate: Secondary | ICD-10-CM | POA: Diagnosis not present

## 2022-11-23 DIAGNOSIS — K8000 Calculus of gallbladder with acute cholecystitis without obstruction: Secondary | ICD-10-CM | POA: Diagnosis not present

## 2022-11-23 LAB — COMPREHENSIVE METABOLIC PANEL
ALT: 25 U/L (ref 0–44)
AST: 20 U/L (ref 15–41)
Albumin: 3.8 g/dL (ref 3.5–5.0)
Alkaline Phosphatase: 67 U/L (ref 38–126)
Anion gap: 8 (ref 5–15)
BUN: 6 mg/dL (ref 6–20)
CO2: 28 mmol/L (ref 22–32)
Calcium: 9 mg/dL (ref 8.9–10.3)
Chloride: 102 mmol/L (ref 98–111)
Creatinine, Ser: 0.96 mg/dL (ref 0.61–1.24)
GFR, Estimated: >60 mL/min (ref >60)
Glucose, Bld: 117 mg/dL — ABNORMAL HIGH (ref 70–99)
Potassium: 4.3 mmol/L (ref 3.5–5.1)
Sodium: 138 mmol/L (ref 135–145)
Total Bilirubin: 0.6 mg/dL (ref 0.3–1.2)
Total Protein: 6.8 g/dL (ref 6.5–8.1)

## 2022-11-23 LAB — CBC
HCT: 42.4 % (ref 39.0–52.0)
Hemoglobin: 14.5 g/dL (ref 13.0–17.0)
MCH: 31.9 pg (ref 26.0–34.0)
MCHC: 34.2 g/dL (ref 30.0–36.0)
MCV: 93.2 fL (ref 80.0–100.0)
Platelets: 253 10*3/uL (ref 150–400)
RBC: 4.55 MIL/uL (ref 4.22–5.81)
RDW: 12.5 % (ref 11.5–15.5)
WBC: 6.5 10*3/uL (ref 4.0–10.5)
nRBC: 0 % (ref 0.0–0.2)

## 2022-11-23 MED ORDER — ONDANSETRON 4 MG PO TBDP: 4.0000 mg | ORAL_TABLET | Freq: Four times a day (QID) | ORAL | Status: DC | PRN

## 2022-11-23 MED ORDER — ONDANSETRON HCL 4 MG/2ML IJ SOLN
4.0000 mg | Freq: Four times a day (QID) | INTRAMUSCULAR | Status: DC | PRN
  Administered 2022-11-23 – 2022-11-24 (×4): 4 mg via INTRAVENOUS
  Filled 2022-11-23 (×5): qty 2

## 2022-11-23 MED ORDER — FENTANYL CITRATE PF 50 MCG/ML IJ SOSY
50.0000 ug | PREFILLED_SYRINGE | INTRAMUSCULAR | Status: DC | PRN
  Administered 2022-11-23 – 2022-11-24 (×10): 50 ug via INTRAVENOUS
  Filled 2022-11-23 (×10): qty 1

## 2022-11-23 MED ORDER — ONDANSETRON HCL 4 MG/2ML IJ SOLN: 4.0000 mg | Freq: Once | INTRAMUSCULAR | Status: AC

## 2022-11-23 MED ORDER — SODIUM CHLORIDE 0.9 % IV SOLN
2.0000 g | Freq: Once | INTRAVENOUS | Status: AC
  Administered 2022-11-23: 2 g via INTRAVENOUS
  Filled 2022-11-23: qty 20

## 2022-11-23 MED ORDER — KCL IN DEXTROSE-NACL 40-5-0.9 MEQ/L-%-% IV SOLN
INTRAVENOUS | Status: DC
  Filled 2022-11-23 (×5): qty 1000

## 2022-11-23 MED ORDER — SODIUM CHLORIDE 0.9 % IV SOLN
2.0000 g | INTRAVENOUS | Status: DC
  Administered 2022-11-23 – 2022-11-24 (×2): 2 g via INTRAVENOUS
  Filled 2022-11-23 (×2): qty 20

## 2022-11-23 MED ORDER — HYDROMORPHONE HCL 1 MG/ML IJ SOLN
1.0000 mg | INTRAMUSCULAR | Status: DC | PRN
  Administered 2022-11-23: 2 mg via INTRAVENOUS
  Administered 2022-11-24 (×3): 1 mg via INTRAVENOUS
  Filled 2022-11-23 (×2): qty 1
  Filled 2022-11-23 (×2): qty 2

## 2022-11-23 NOTE — ED Notes (Signed)
ED TO INPATIENT HANDOFF REPORT  ED Nurse Name and Phone #: Kalisha Keadle RN [phone] Name/Age/Gender Jim Warner 56 y.o. male Room/Bed: 040C/040C  Code Status   Code Status: Full Code  Home/SNF/Other Home Patient oriented to: self, place, time, and situation Is this baseline? Yes   Triage Complete: Triage complete  Chief Complaint Acute cholecystitis [K81.0]  Triage Note Patient here POV from Home  Endorses Epigastric ABD Pain that began 2 Days ago. Worsened since.   Nausea. No Emesis. No Diarrhea.   NAD Noted during Triage. A&Ox4. GCS 15. Ambulatory.    Allergies No Known Allergies  Level of Care/Admitting Diagnosis ED Disposition     ED Disposition  Admit   Condition  --   Comment  Hospital Area: [facility] [[id number]]  Level of Care: Med-Surg [16]  May place patient in observation at [facility] or [facility] if equivalent level of care is available:: No  Covid Evaluation: Asymptomatic - no recent exposure (last 10 days) testing not required  Diagnosis: Acute cholecystitis [575.0.ICD-9-CM]  Admitting Physician: [place], [place]  Attending Physician: [facility], MD [[id number]]  Bed request comments: 6N          B Medical/Surgery History Past Medical History:  Diagnosis Date   Anxiety    Depression    Past Surgical History:  Procedure Laterality Date   left knee surgery      left shoulder surgery      PELVIC LYMPH NODE DISSECTION Bilateral 05/17/2022   Procedure: BILATERAL PELVIC LYMPH NODE DISSECTION;  Surgeon: Ardis Hughs, MD;  Location: [facility];  Service: Urology;  Laterality: Bilateral;   ROBOT ASSISTED LAPAROSCOPIC RADICAL PROSTATECTOMY N/A 05/17/2022   Procedure: XI ROBOTIC ASSISTED LAPAROSCOPIC RADICAL PROSTATECTOMY;  Surgeon: Ardis Hughs, MD;  Location: [facility];  Service: Urology;  Laterality: N/A;     A IV Location/Drains/Wounds Patient Lines/Drains/Airways Status     Active Line/Drains/Airways     Name Placement date  Placement time Site Days   Peripheral IV 11/22/22 20 G 1" Right Antecubital 11/22/22  2205  Antecubital  1   Urethral Catheter Gardiner Sleeper ST Latex;Coude 15 Fr. 05/17/22  1505  Latex;Coude  190   Incision - 6 Ports Abdomen Left;Lateral;Lower Left;Lateral;Upper Umbilicus;Superior Right;Lateral;Lower Right;Medial;Upper Right;Lateral;Upper 05/17/22  1248  -- 190            Intake/Output Last 24 hours  Intake/Output Summary (Last 24 hours) at 11/23/2022 1505 Last data filed at 11/23/2022 1338 Gross per 24 hour  Intake 1463.73 ml  Output 700 ml  Net 763.73 ml    Labs/Imaging Results for orders placed or performed during the hospital encounter of 11/22/22 (from the past 48 hour(s))  Lipase, blood     Status: None   Collection Time: 11/22/22  5:24 PM  Result Value Ref Range   Lipase 32 11 - 51 U/L    Comment: Performed at [facility], [address] [place], [place] [address]  Comprehensive metabolic panel     Status: None   Collection Time: 11/22/22  5:24 PM  Result Value Ref Range   Sodium 139 135 - 145 mmol/L   Potassium 3.9 3.5 - 5.1 mmol/L   Chloride 100 98 - 111 mmol/L   CO2 30 22 - 32 mmol/L   Glucose, Bld 98 70 - 99 mg/dL    Comment: Glucose reference range applies only to samples taken after fasting for at least 8 hours.   BUN 8 6 -  20 mg/dL   Creatinine, Ser 0.93 0.61 - 1.24 mg/dL   Calcium 9.6 8.9 - 10.3 mg/dL   Total Protein 7.6 6.5 - 8.1 g/dL   Albumin 4.8 3.5 - 5.0 g/dL   AST 17 15 - 41 U/L   ALT 22 0 - 44 U/L   Alkaline Phosphatase 72 38 - 126 U/L   Total Bilirubin 0.4 0.3 - 1.2 mg/dL   GFR, Estimated >60 >60 mL/min    Comment: (NOTE) Calculated using the CKD-EPI Creatinine Equation (2021)    Anion gap 9 5 - 15    Comment: Performed at [facility], [address], [place], [place] [address]  CBC     Status: None   Collection Time: 11/22/22  5:24 PM  Result Value Ref Range   WBC 6.1 4.0 - 10.5 K/uL   RBC 4.86  4.22 - 5.81 MIL/uL   Hemoglobin 15.2 13.0 - 17.0 g/dL   HCT 45.3 39.0 - 52.0 %   MCV 93.2 80.0 - 100.0 fL   MCH 31.3 26.0 - 34.0 pg   MCHC 33.6 30.0 - 36.0 g/dL   RDW 12.4 11.5 - 15.5 %   Platelets 250 150 - 400 K/uL   nRBC 0.0 0.0 - 0.2 %    Comment: Performed at [facility], [address], [place], [place] [address]  Urinalysis, Routine w reflex microscopic Urine, Clean Catch     Status: None   Collection Time: 11/22/22  5:24 PM  Result Value Ref Range   Color, Urine YELLOW YELLOW   APPearance CLEAR CLEAR   Specific Gravity, Urine 1.019 1.005 - 1.030   pH 6.5 5.0 - 8.0   Glucose, UA NEGATIVE NEGATIVE mg/dL   Hgb urine dipstick NEGATIVE NEGATIVE   Bilirubin Urine NEGATIVE NEGATIVE   Ketones, ur NEGATIVE NEGATIVE mg/dL   Protein, ur NEGATIVE NEGATIVE mg/dL   Nitrite NEGATIVE NEGATIVE   Leukocytes,Ua NEGATIVE NEGATIVE    Comment: Performed at [facility], [place], [place] [address]  Comprehensive metabolic panel     Status: Abnormal   Collection Time: 11/23/22  7:33 AM  Result Value Ref Range   Sodium 138 135 - 145 mmol/L   Potassium 4.3 3.5 - 5.1 mmol/L   Chloride 102 98 - 111 mmol/L   CO2 28 22 - 32 mmol/L   Glucose, Bld 117 (H) 70 - 99 mg/dL    Comment: Glucose reference range applies only to samples taken after fasting for at least 8 hours.   BUN 6 6 - 20 mg/dL   Creatinine, Ser 0.96 0.61 - 1.24 mg/dL   Calcium 9.0 8.9 - 10.3 mg/dL   Total Protein 6.8 6.5 - 8.1 g/dL   Albumin 3.8 3.5 - 5.0 g/dL   AST 20 15 - 41 U/L   ALT 25 0 - 44 U/L   Alkaline Phosphatase 67 38 - 126 U/L   Total Bilirubin 0.6 0.3 - 1.2 mg/dL   GFR, Estimated >60 >60 mL/min    Comment: (NOTE) Calculated using the CKD-EPI Creatinine Equation (2021)    Anion gap 8 5 - 15    Comment: Performed at [facility] [address], [place] [address]  CBC     Status: None   Collection Time: 11/23/22  7:33 AM  Result Value Ref Range    WBC 6.5 4.0 - 10.5 K/uL   RBC 4.55 4.22 - 5.81 MIL/uL   Hemoglobin 14.5 13.0 - 17.0 g/dL  HCT 42.4 39.0 - 52.0 %   MCV 93.2 80.0 - 100.0 fL   MCH 31.9 26.0 - 34.0 pg   MCHC 34.2 30.0 - 36.0 g/dL   RDW 12.5 11.5 - 15.5 %   Platelets 253 150 - 400 K/uL   nRBC 0.0 0.0 - 0.2 %    Comment: Performed at [facility], [place] [address], [place], [place] [address]   US Abdomen Limited RUQ (LIVER/GB)  Result Date: 11/23/2022 CLINICAL DATA:  Epigastric abdominal pain, nausea EXAM: ULTRASOUND ABDOMEN LIMITED RIGHT UPPER QUADRANT COMPARISON:  None Available. FINDINGS: Gallbladder: The gallbladder contains layering stones and sludge with a 16 mm stone seen impacted within the gallbladder neck. The gallbladder wall is thickened measuring up to 6 mm. No pericholecystic fluid. The sonographic Percell Miller sign is reportedly positive. Common bile duct: Diameter: 5 mm in proximal diameter Liver: No focal lesion identified. Within normal limits in parenchymal echogenicity. Portal vein is patent on color Doppler imaging with normal direction of blood flow towards the liver. Other: None. IMPRESSION: 1. Cholelithiasis with sonographic evidence of acute cholecystitis. Electronically Signed   By: Fidela Salisbury M.D.   On: 11/23/2022 00:47   CT ABDOMEN PELVIS W CONTRAST  Result Date: 11/22/2022 CLINICAL DATA:  Abdominal pain, bowel obstruction suspected. Worsening epigastric abdominal pain with nausea. EXAM: CT ABDOMEN AND PELVIS WITH CONTRAST TECHNIQUE: Multidetector CT imaging of the abdomen and pelvis was performed using the standard protocol following bolus administration of intravenous contrast. RADIATION DOSE REDUCTION: This exam was performed according to the departmental dose-optimization program which includes automated exposure control, adjustment of the mA and/or kV according to patient size and/or use of iterative reconstruction technique. CONTRAST:  31m OMNIPAQUE IOHEXOL 300 MG/ML  SOLN COMPARISON:  None  Available. FINDINGS: Lower chest: Dependent atelectasis is present at the lung bases. Hepatobiliary: No focal abnormality in the liver. No biliary ductal dilatation. The gallbladder is without stones. There is suggestion of gallbladder wall thickening at proximally 4 mm. Mild fat stranding is noted at the gallbladder fossa. Pancreas: Unremarkable. No pancreatic ductal dilatation or surrounding inflammatory changes. Spleen: Normal in size without focal abnormality. Adrenals/Urinary Tract: No adrenal nodule or mass. The kidneys enhance symmetrically. Cysts are present in the right kidney. No renal calculus or hydronephrosis. The bladder is unremarkable. Stomach/Bowel: Stomach is within normal limits. Appendix appears normal. No evidence of bowel wall thickening, distention, or inflammatory changes. No free air or pneumatosis. Vascular/Lymphatic: Aortic atherosclerosis. No enlarged abdominal or pelvic lymph nodes. Reproductive: Prostate gland is not well seen. Other: No abdominopelvic ascites. Small fat containing umbilical hernia. Musculoskeletal: Degenerative changes are present in the thoracolumbar spine. No acute osseous abnormality. IMPRESSION: 1. Mild gallbladder wall thickening with fat stranding at the gallbladder fossa. No cholelithiasis is seen. Ultrasound is recommended for further evaluation. 2. Right renal cysts. 3. Aortic atherosclerosis. Electronically Signed   By: LBrett FairyM.D.   On: 11/22/2022 22:36    Pending Labs Unresulted Labs (From admission, onward)    None       Vitals/Pain Today's Vitals   11/23/22 1330 11/23/22 1345 11/23/22 1400 11/23/22 1403  BP: 119/74 115/81 112/79   Pulse: (!) 47 (!) 49 (!) 52   Resp:      Temp:      TempSrc:      SpO2: 100% 95% 94%   Weight:      Height:      PainSc:    5     Isolation Precautions No active isolations  Medications Medications  dextrose 5 % and 0.9 % NaCl with KCl 40 mEq/L infusion ( Intravenous Rate/Dose Verify 11/23/22  1416)  cefTRIAXone (ROCEPHIN) 2 g in sodium chloride 0.9 % 100 mL IVPB (has no administration in time range)  HYDROmorphone (DILAUDID) injection 1-2 mg (has no administration in time range)  ondansetron (ZOFRAN-ODT) disintegrating tablet 4 mg ( Oral See Alternative 11/23/22 1323)    Or  ondansetron (ZOFRAN) injection 4 mg (4 mg Intravenous Given 11/23/22 1323)  fentaNYL (SUBLIMAZE) injection 50 mcg (50 mcg Intravenous Given 11/23/22 1324)  fentaNYL (SUBLIMAZE) injection 50 mcg (50 mcg Intravenous Given 11/22/22 2206)  ondansetron (ZOFRAN) injection 4 mg (4 mg Intravenous Given 11/22/22 2205)  lactated ringers bolus 1,000 mL (0 mLs Intravenous Stopped 11/23/22 0700)  iohexol (OMNIPAQUE) 300 MG/ML solution 100 mL (80 mLs Intravenous Contrast Given 11/22/22 2212)  fentaNYL (SUBLIMAZE) injection 50 mcg (50 mcg Intravenous Given 11/22/22 2340)  ondansetron (ZOFRAN) injection 4 mg (4 mg Intravenous Given 11/23/22 0004)  cefTRIAXone (ROCEPHIN) 2 g in sodium chloride 0.9 % 100 mL IVPB (0 g Intravenous Stopped 11/23/22 0312)    Mobility walks Low fall risk   Focused Assessments Cardiac Assessment Handoff:    No results found for: "CKTOTAL", "CKMB", "CKMBINDEX", "TROPONINI" No results found for: "DDIMER" Does the Patient currently have chest pain? No   , Pulmonary Assessment Handoff:  Lung sounds:   O2 Device: Room Air      R Recommendations: See Admitting Provider Note  Report given to:   Additional Notes:

## 2022-11-23 NOTE — ED Notes (Signed)
Consult to General Surgery for Accute Colicysitis.  Called [facility] at 11:45 for consult to General Surgery.   They were unable to handle, I paged General Surgery '@11'$ :48.   Patient being transferred to [facility].  Dr. Johney Maine accepting.  ED to ED Transfer

## 2022-11-23 NOTE — H&P (Signed)
Jim Warner is an 56 y.o. male.   Chief Complaint: Abdominal pain HPI: Patient is a 56 year old male comes in secondary to abdominal pain.  He states this began yesterday.  He states that this was epigastric/right upper quadrant.  He states this did radiate to the back.  He states he had some nausea however no emesis.  He states this is his first occurrence of pain.  Patient was seen in outside ER.  Patient underwent Vitor studies and CT scan.  Patient had no leukocytosis and had normal LFTs.  Patient underwent ultrasound which revealed cholelithiasis with gallbladder wall thickening and a normal common bile duct.  I did review the patient's CT scan and laboratory studies personally.    Of note patient recently had robotic prostatectomy by Dr. Louis Meckel May 17, 2022.  Past Medical History:  Diagnosis Date   Anxiety    Depression     Past Surgical History:  Procedure Laterality Date   left knee surgery      left shoulder surgery      PELVIC LYMPH NODE DISSECTION Bilateral 05/17/2022   Procedure: BILATERAL PELVIC LYMPH NODE DISSECTION;  Surgeon: Ardis Hughs, MD;  Location: [facility];  Service: Urology;  Laterality: Bilateral;   ROBOT ASSISTED LAPAROSCOPIC RADICAL PROSTATECTOMY N/A 05/17/2022   Procedure: XI ROBOTIC ASSISTED LAPAROSCOPIC RADICAL PROSTATECTOMY;  Surgeon: Ardis Hughs, MD;  Location: [facility];  Service: Urology;  Laterality: N/A;    History reviewed. No pertinent family history. Social History:  reports that he has quit smoking. His smoking use included cigarettes. His smokeless tobacco use includes snuff. He reports current alcohol use. He reports that he does not use drugs.  Allergies: No Known Allergies  (Not in a hospital admission)   Results for orders placed or performed during the hospital encounter of 11/22/22 (from the past 48 hour(s))  Lipase, blood     Status: None   Collection Time: 11/22/22  5:24 PM  Result Value Ref Range   Lipase 32 11 - 51 U/L     Comment: Performed at [facility], [address], [place], [place] [address]  Comprehensive metabolic panel     Status: None   Collection Time: 11/22/22  5:24 PM  Result Value Ref Range   Sodium 139 135 - 145 mmol/L   Potassium 3.9 3.5 - 5.1 mmol/L   Chloride 100 98 - 111 mmol/L   CO2 30 22 - 32 mmol/L   Glucose, Bld 98 70 - 99 mg/dL    Comment: Glucose reference range applies only to samples taken after fasting for at least 8 hours.   BUN 8 6 - 20 mg/dL   Creatinine, Ser 0.93 0.61 - 1.24 mg/dL   Calcium 9.6 8.9 - 10.3 mg/dL   Total Protein 7.6 6.5 - 8.1 g/dL   Albumin 4.8 3.5 - 5.0 g/dL   AST 17 15 - 41 U/L   ALT 22 0 - 44 U/L   Alkaline Phosphatase 72 38 - 126 U/L   Total Bilirubin 0.4 0.3 - 1.2 mg/dL   GFR, Estimated >60 >60 mL/min    Comment: (NOTE) Calculated using the CKD-EPI Creatinine Equation (2021)    Anion gap 9 5 - 15    Comment: Performed at [facility], [address] [place], [place] [address]  CBC     Status: None   Collection Time: 11/22/22  5:24 PM  Result Value Ref Range   WBC 6.1 4.0 - 10.5 K/uL   RBC 4.86  4.22 - 5.81 MIL/uL   Hemoglobin 15.2 13.0 - 17.0 g/dL   HCT 45.3 39.0 - 52.0 %   MCV 93.2 80.0 - 100.0 fL   MCH 31.3 26.0 - 34.0 pg   MCHC 33.6 30.0 - 36.0 g/dL   RDW 12.4 11.5 - 15.5 %   Platelets 250 150 - 400 K/uL   nRBC 0.0 0.0 - 0.2 %    Comment: Performed at [facility], [address], [place], [place] [address]  Urinalysis, Routine w reflex microscopic Urine, Clean Catch     Status: None   Collection Time: 11/22/22  5:24 PM  Result Value Ref Range   Color, Urine YELLOW YELLOW   APPearance CLEAR CLEAR   Specific Gravity, Urine 1.019 1.005 - 1.030   pH 6.5 5.0 - 8.0   Glucose, UA NEGATIVE NEGATIVE mg/dL   Hgb urine dipstick NEGATIVE NEGATIVE   Bilirubin Urine NEGATIVE NEGATIVE   Ketones, ur NEGATIVE NEGATIVE mg/dL   Protein, ur NEGATIVE NEGATIVE mg/dL   Nitrite NEGATIVE  NEGATIVE   Leukocytes,Ua NEGATIVE NEGATIVE    Comment: Performed at [facility], [place], [place] [address]   US Abdomen Limited RUQ (LIVER/GB)  Result Date: 11/23/2022 CLINICAL DATA:  Epigastric abdominal pain, nausea EXAM: ULTRASOUND ABDOMEN LIMITED RIGHT UPPER QUADRANT COMPARISON:  None Available. FINDINGS: Gallbladder: The gallbladder contains layering stones and sludge with a 16 mm stone seen impacted within the gallbladder neck. The gallbladder wall is thickened measuring up to 6 mm. No pericholecystic fluid. The sonographic Percell Miller sign is reportedly positive. Common bile duct: Diameter: 5 mm in proximal diameter Liver: No focal lesion identified. Within normal limits in parenchymal echogenicity. Portal vein is patent on color Doppler imaging with normal direction of blood flow towards the liver. Other: None. IMPRESSION: 1. Cholelithiasis with sonographic evidence of acute cholecystitis. Electronically Signed   By: Fidela Salisbury M.D.   On: 11/23/2022 00:47   CT ABDOMEN PELVIS W CONTRAST  Result Date: 11/22/2022 CLINICAL DATA:  Abdominal pain, bowel obstruction suspected. Worsening epigastric abdominal pain with nausea. EXAM: CT ABDOMEN AND PELVIS WITH CONTRAST TECHNIQUE: Multidetector CT imaging of the abdomen and pelvis was performed using the standard protocol following bolus administration of intravenous contrast. RADIATION DOSE REDUCTION: This exam was performed according to the departmental dose-optimization program which includes automated exposure control, adjustment of the mA and/or kV according to patient size and/or use of iterative reconstruction technique. CONTRAST:  78m OMNIPAQUE IOHEXOL 300 MG/ML  SOLN COMPARISON:  None Available. FINDINGS: Lower chest: Dependent atelectasis is present at the lung bases. Hepatobiliary: No focal abnormality in the liver. No biliary ductal dilatation. The gallbladder is without stones. There is suggestion of  gallbladder wall thickening at proximally 4 mm. Mild fat stranding is noted at the gallbladder fossa. Pancreas: Unremarkable. No pancreatic ductal dilatation or surrounding inflammatory changes. Spleen: Normal in size without focal abnormality. Adrenals/Urinary Tract: No adrenal nodule or mass. The kidneys enhance symmetrically. Cysts are present in the right kidney. No renal calculus or hydronephrosis. The bladder is unremarkable. Stomach/Bowel: Stomach is within normal limits. Appendix appears normal. No evidence of bowel wall thickening, distention, or inflammatory changes. No free air or pneumatosis. Vascular/Lymphatic: Aortic atherosclerosis. No enlarged abdominal or pelvic lymph nodes. Reproductive: Prostate gland is not well seen. Other: No abdominopelvic ascites. Small fat containing umbilical hernia. Musculoskeletal: Degenerative changes are present in the thoracolumbar spine. No acute osseous abnormality. IMPRESSION: 1. Mild gallbladder wall thickening with fat stranding at the gallbladder fossa. No  cholelithiasis is seen. Ultrasound is recommended for further evaluation. 2. Right renal cysts. 3. Aortic atherosclerosis. Electronically Signed   By: Brett Fairy M.D.   On: 11/22/2022 22:36    Review of Systems  Constitutional:  Negative for chills and fever.  HENT:  Negative for ear discharge, hearing loss and sore throat.   Eyes:  Negative for discharge.  Respiratory:  Negative for cough and shortness of breath.   Cardiovascular:  Negative for chest pain and leg swelling.  Gastrointestinal:  Positive for abdominal pain and nausea. Negative for constipation, diarrhea and vomiting.  Musculoskeletal:  Negative for myalgias and neck pain.  Skin:  Negative for rash.  Allergic/Immunologic: Negative for environmental allergies.  Neurological:  Negative for dizziness and seizures.  Hematological:  Does not bruise/bleed easily.  Psychiatric/Behavioral:  Negative for suicidal ideas.   All other  systems reviewed and are negative.   Blood pressure 135/79, pulse 69, temperature 98.6 F (37 C), temperature source Oral, resp. rate 18, height '6\' 1"'$  (1.854 m), weight 81.2 kg, SpO2 96 %. Physical Exam Constitutional:      Appearance: He is well-developed.     Comments: Conversant No acute distress  HENT:     Head: Normocephalic and atraumatic.  Eyes:     General: Lids are normal. No scleral icterus.    Pupils: Pupils are equal, round, and reactive to light.     Comments: Pupils are equal round and reactive No lid lag Moist conjunctiva  Neck:     Thyroid: No thyromegaly.     Trachea: No tracheal tenderness.     Comments: No cervical lymphadenopathy Cardiovascular:     Rate and Rhythm: Normal rate and regular rhythm.     Heart sounds: No murmur heard. Pulmonary:     Effort: Pulmonary effort is normal.     Breath sounds: Normal breath sounds. No wheezing or rales.  Abdominal:     Tenderness: There is abdominal tenderness. Positive signs include Murphy's sign.     Hernia: No hernia is present.  Musculoskeletal:     Cervical back: Normal range of motion and neck supple.  Skin:    General: Skin is warm.     Findings: No rash.     Nails: There is no clubbing.     Comments: Normal skin turgor  Neurological:     Mental Status: He is alert and oriented to person, place, and time.     Comments: Normal gait and station  Psychiatric:        Mood and Affect: Mood normal.        Thought Content: Thought content normal.        Judgment: Judgment normal.     Comments: Appropriate affect      Assessment/Plan 56 year old male with acute cholecystitis. H/o prostate ca  1.  Patient will proceed to the operating room later today with Dr. Marlou Starks for a lap chole. 2. All risks and benefits were discussed with the patient to generally include: infection, bleeding, possible need for post op ERCP, damage to the bile ducts, and bile leak. Alternatives were offered and described.  All  questions were answered and the patient voiced understanding of the procedure and wishes to proceed at this point with a laparoscopic cholecystectomy   Ralene Ok, MD 11/23/2022, 1:23 AM

## 2022-11-23 NOTE — Plan of Care (Signed)
1600: Pt a new admit to [facility], pt alert and oriented x4, pt independent and ambulatory, pt wife at bedside, pt on room air, pt skin intact, pt having tenderness to abdomen, pt on clear liquid diet, pt made aware to be NPO at midnight

## 2022-11-23 NOTE — ED Notes (Signed)
Pt arrived from [facility] to see surgeon for cholecystitis. [facility] gave 61mg fentanyl at 0059. Pain currently 6/10

## 2022-11-23 NOTE — ED Provider Notes (Signed)
Patient transferred from [facility] for EGS consultation. Mild pain still present but tolerable. Mild nausea, worsens with pain, otherwise tolerable. Surgery already placed bed request.    Merrily Pew, MD 11/23/22 0131

## 2022-11-24 ENCOUNTER — Observation Stay (HOSPITAL_BASED_OUTPATIENT_CLINIC_OR_DEPARTMENT_OTHER): Payer: No Typology Code available for payment source | Admitting: Certified Registered"

## 2022-11-24 ENCOUNTER — Other Ambulatory Visit: Payer: Self-pay

## 2022-11-24 ENCOUNTER — Observation Stay (HOSPITAL_COMMUNITY): Payer: No Typology Code available for payment source | Admitting: Certified Registered"

## 2022-11-24 ENCOUNTER — Encounter (HOSPITAL_COMMUNITY)
Admission: EM | Disposition: A | Discharge status: Home / Self Care | Payer: Self-pay | Source: Home / Self Care | Attending: Emergency Medicine

## 2022-11-24 ENCOUNTER — Encounter (HOSPITAL_COMMUNITY): Payer: Self-pay

## 2022-11-24 DIAGNOSIS — K810 Acute cholecystitis: Secondary | ICD-10-CM

## 2022-11-24 HISTORY — PX: CHOLECYSTECTOMY: SHX55

## 2022-11-24 LAB — CBC
HCT: 42.6 % (ref 39.0–52.0)
Hemoglobin: 14.3 g/dL (ref 13.0–17.0)
MCH: 31.3 pg (ref 26.0–34.0)
MCHC: 33.6 g/dL (ref 30.0–36.0)
MCV: 93.2 fL (ref 80.0–100.0)
Platelets: 242 10*3/uL (ref 150–400)
RBC: 4.57 MIL/uL (ref 4.22–5.81)
RDW: 12.4 % (ref 11.5–15.5)
WBC: 5.2 10*3/uL (ref 4.0–10.5)
nRBC: 0 % (ref 0.0–0.2)

## 2022-11-24 LAB — COMPREHENSIVE METABOLIC PANEL
ALT: 23 U/L (ref 0–44)
AST: 19 U/L (ref 15–41)
Albumin: 3.6 g/dL (ref 3.5–5.0)
Alkaline Phosphatase: 69 U/L (ref 38–126)
Anion gap: 10 (ref 5–15)
BUN: 7 mg/dL (ref 6–20)
CO2: 27 mmol/L (ref 22–32)
Calcium: 8.9 mg/dL (ref 8.9–10.3)
Chloride: 99 mmol/L (ref 98–111)
Creatinine, Ser: 1.04 mg/dL (ref 0.61–1.24)
GFR, Estimated: >60 mL/min (ref >60)
Glucose, Bld: 125 mg/dL — ABNORMAL HIGH (ref 70–99)
Potassium: 4.5 mmol/L (ref 3.5–5.1)
Sodium: 136 mmol/L (ref 135–145)
Total Bilirubin: 0.7 mg/dL (ref 0.3–1.2)
Total Protein: 6.6 g/dL (ref 6.5–8.1)

## 2022-11-24 SURGERY — LAPAROSCOPIC CHOLECYSTECTOMY
Anesthesia: General | Site: Abdomen

## 2022-11-24 MED ORDER — FENTANYL CITRATE (PF) 250 MCG/5ML IJ SOLN
INTRAMUSCULAR | Status: AC
  Filled 2022-11-24: qty 5

## 2022-11-24 MED ORDER — OXYCODONE HCL 5 MG PO TABS
5.0000 mg | ORAL_TABLET | ORAL | Status: DC | PRN
  Administered 2022-11-24 (×2): 5 mg via ORAL
  Filled 2022-11-24 (×2): qty 1

## 2022-11-24 MED ORDER — PROPOFOL 10 MG/ML IV BOLUS
INTRAVENOUS | Status: DC | PRN
  Administered 2022-11-24: 200 mg via INTRAVENOUS

## 2022-11-24 MED ORDER — ONDANSETRON HCL 4 MG/2ML IJ SOLN
INTRAMUSCULAR | Status: DC | PRN
  Administered 2022-11-24: 4 mg via INTRAVENOUS

## 2022-11-24 MED ORDER — BUPIVACAINE-EPINEPHRINE (PF) 0.5% -1:200000 IJ SOLN
INTRAMUSCULAR | Status: DC | PRN
  Administered 2022-11-24: 22 mL

## 2022-11-24 MED ORDER — ORAL CARE MOUTH RINSE: 15.0000 mL | Freq: Once | OROMUCOSAL | Status: AC

## 2022-11-24 MED ORDER — SUGAMMADEX SODIUM 200 MG/2ML IV SOLN
INTRAVENOUS | Status: DC | PRN
  Administered 2022-11-24: 400 mg via INTRAVENOUS

## 2022-11-24 MED ORDER — MIDAZOLAM HCL 2 MG/2ML IJ SOLN
INTRAMUSCULAR | Status: AC
  Filled 2022-11-24: qty 2

## 2022-11-24 MED ORDER — LIDOCAINE 2% (20 MG/ML) 5 ML SYRINGE
INTRAMUSCULAR | Status: DC | PRN
  Administered 2022-11-24: 60 mg via INTRAVENOUS

## 2022-11-24 MED ORDER — DEXAMETHASONE SODIUM PHOSPHATE 10 MG/ML IJ SOLN
INTRAMUSCULAR | Status: DC | PRN
  Administered 2022-11-24: 10 mg via INTRAVENOUS

## 2022-11-24 MED ORDER — MIDAZOLAM HCL 2 MG/2ML IJ SOLN
INTRAMUSCULAR | Status: DC | PRN
  Administered 2022-11-24: 2 mg via INTRAVENOUS

## 2022-11-24 MED ORDER — CHLORHEXIDINE GLUCONATE 0.12 % MT SOLN
OROMUCOSAL | Status: AC
  Administered 2022-11-24: 15 mL via OROMUCOSAL
  Filled 2022-11-24: qty 15

## 2022-11-24 MED ORDER — LACTATED RINGERS IV SOLN: INTRAVENOUS | Status: DC

## 2022-11-24 MED ORDER — ROCURONIUM BROMIDE 10 MG/ML (PF) SYRINGE
PREFILLED_SYRINGE | INTRAVENOUS | Status: DC | PRN
  Administered 2022-11-24: 70 mg via INTRAVENOUS
  Administered 2022-11-24: 10 mg via INTRAVENOUS

## 2022-11-24 MED ORDER — CHLORHEXIDINE GLUCONATE 0.12 % MT SOLN: 15.0000 mL | Freq: Once | OROMUCOSAL | Status: AC

## 2022-11-24 MED ORDER — SODIUM CHLORIDE 0.9 % IR SOLN
Status: DC | PRN
  Administered 2022-11-24: 1000 mL

## 2022-11-24 MED ORDER — FENTANYL CITRATE (PF) 250 MCG/5ML IJ SOLN
INTRAMUSCULAR | Status: DC | PRN
  Administered 2022-11-24 (×3): 50 ug via INTRAVENOUS

## 2022-11-24 MED ORDER — 0.9 % SODIUM CHLORIDE (POUR BTL) OPTIME
TOPICAL | Status: DC | PRN
  Administered 2022-11-24: 1000 mL

## 2022-11-24 SURGICAL SUPPLY — 35 items
APPLIER CLIP 5 13 M/L LIGAMAX5 (MISCELLANEOUS) ×1
BAG COUNTER SPONGE SURGICOUNT (BAG) ×1 IMPLANT
BLADE CLIPPER SURG (BLADE) IMPLANT
CANISTER SUCT 3000ML PPV (MISCELLANEOUS) ×1 IMPLANT
CHLORAPREP W/TINT 26 (MISCELLANEOUS) ×1 IMPLANT
CLIP APPLIE 5 13 M/L LIGAMAX5 (MISCELLANEOUS) ×1 IMPLANT
COVER SURGICAL LIGHT HANDLE (MISCELLANEOUS) ×1 IMPLANT
DERMABOND ADVANCED .7 DNX12 (GAUZE/BANDAGES/DRESSINGS) ×1 IMPLANT
ELECT REM PT RETURN 9FT ADLT (ELECTROSURGICAL) ×1
ELECTRODE REM PT RTRN 9FT ADLT (ELECTROSURGICAL) ×1 IMPLANT
GLOVE BIO SURGEON STRL SZ7.5 (GLOVE) ×1 IMPLANT
GLOVE BIOGEL PI IND STRL 6 (GLOVE) IMPLANT
GLOVE SURG SS PI 7.0 STRL IVOR (GLOVE) IMPLANT
GOWN STRL REUS W/ TWL LRG LVL3 (GOWN DISPOSABLE) ×3 IMPLANT
GOWN STRL REUS W/TWL LRG LVL3 (GOWN DISPOSABLE) ×3
KIT BASIN OR (CUSTOM PROCEDURE TRAY) ×1 IMPLANT
KIT TURNOVER KIT B (KITS) ×1 IMPLANT
NS IRRIG 1000ML POUR BTL (IV SOLUTION) ×1 IMPLANT
PAD ARMBOARD 7.5X6 YLW CONV (MISCELLANEOUS) ×1 IMPLANT
POUCH SPECIMEN RETRIEVAL 10MM (ENDOMECHANICALS) ×1 IMPLANT
SCISSORS LAP 5X35 DISP (ENDOMECHANICALS) ×1 IMPLANT
SET IRRIG TUBING LAPAROSCOPIC (IRRIGATION / IRRIGATOR) ×1 IMPLANT
SET TUBE SMOKE EVAC HIGH FLOW (TUBING) ×1 IMPLANT
SLEEVE Z-THREAD 5X100MM (TROCAR) IMPLANT
SPECIMEN JAR SMALL (MISCELLANEOUS) ×1 IMPLANT
SUT MNCRL AB 4-0 PS2 18 (SUTURE) ×1 IMPLANT
SYS BAG RETRIEVAL 10MM (BASKET) ×1
SYSTEM BAG RETRIEVAL 10MM (BASKET) IMPLANT
TOWEL GREEN STERILE (TOWEL DISPOSABLE) ×1 IMPLANT
TOWEL GREEN STERILE FF (TOWEL DISPOSABLE) ×1 IMPLANT
TRAY LAPAROSCOPIC MC (CUSTOM PROCEDURE TRAY) ×1 IMPLANT
TROCAR BALLN 12MMX100 BLUNT (TROCAR) IMPLANT
TROCAR XCEL BLUNT TIP 100MML (ENDOMECHANICALS) ×1 IMPLANT
TROCAR Z-THREAD OPTICAL 5X100M (TROCAR) ×1 IMPLANT
WATER STERILE IRR 1000ML POUR (IV SOLUTION) ×1 IMPLANT

## 2022-11-24 NOTE — H&P (View-Only) (Signed)
Day of Surgery   Subjective/Chief Complaint: Still having RUQ pain   Objective: Vital signs in last 24 hours: Temp:  [98.3 F (36.8 C)-99 F (37.2 C)] 98.6 F (37 C) (12/08 0821) Pulse Rate:  [39-75] 52 (12/08 0821) Resp:  [16-18] 18 (12/08 0821) BP: (112-139)/(74-90) 115/74 (12/08 0821) SpO2:  [86 %-100 %] 94 % (12/08 0821) Weight:  [86.5 kg] 86.5 kg (12/07 1627) Last BM Date : 11/22/22  Intake/Output from previous day: 12/07 0701 - 12/08 0700 In: 2622.1 [P.O.:720; I.V.:1802.1; IV Piggyback:100] Out: 700 [Urine:700] Intake/Output this shift: No intake/output data recorded.  General appearance: alert and cooperative Resp: clear to auscultation bilaterally Cardio: regular rate and rhythm GI: soft, moderate RUQ pain  Lab Results:  Recent Labs    11/23/22 0733 11/24/22 0450  WBC 6.5 5.2  HGB 14.5 14.3  HCT 42.4 42.6  PLT 253 242   BMET Recent Labs    11/23/22 0733 11/24/22 0450  NA 138 136  K 4.3 4.5  CL 102 99  CO2 28 27  GLUCOSE 117* 125*  BUN 6 7  CREATININE 0.96 1.04  CALCIUM 9.0 8.9   PT/INR No results for input(s): "LABPROT", "INR" in the last 72 hours. ABG No results for input(s): "PHART", "HCO3" in the last 72 hours.  Invalid input(s): "PCO2", "PO2"  Studies/Results: US Abdomen Limited RUQ (LIVER/GB)  Result Date: 11/23/2022 CLINICAL DATA:  Epigastric abdominal pain, nausea EXAM: ULTRASOUND ABDOMEN LIMITED RIGHT UPPER QUADRANT COMPARISON:  None Available. FINDINGS: Gallbladder: The gallbladder contains layering stones and sludge with a 16 mm stone seen impacted within the gallbladder neck. The gallbladder wall is thickened measuring up to 6 mm. No pericholecystic fluid. The sonographic Percell Miller sign is reportedly positive. Common bile duct: Diameter: 5 mm in proximal diameter Liver: No focal lesion identified. Within normal limits in parenchymal echogenicity. Portal vein is patent on color Doppler imaging with normal direction of blood flow towards  the liver. Other: None. IMPRESSION: 1. Cholelithiasis with sonographic evidence of acute cholecystitis. Electronically Signed   By: Fidela Salisbury M.D.   On: 11/23/2022 00:47   CT ABDOMEN PELVIS W CONTRAST  Result Date: 11/22/2022 CLINICAL DATA:  Abdominal pain, bowel obstruction suspected. Worsening epigastric abdominal pain with nausea. EXAM: CT ABDOMEN AND PELVIS WITH CONTRAST TECHNIQUE: Multidetector CT imaging of the abdomen and pelvis was performed using the standard protocol following bolus administration of intravenous contrast. RADIATION DOSE REDUCTION: This exam was performed according to the departmental dose-optimization program which includes automated exposure control, adjustment of the mA and/or kV according to patient size and/or use of iterative reconstruction technique. CONTRAST:  64m OMNIPAQUE IOHEXOL 300 MG/ML  SOLN COMPARISON:  None Available. FINDINGS: Lower chest: Dependent atelectasis is present at the lung bases. Hepatobiliary: No focal abnormality in the liver. No biliary ductal dilatation. The gallbladder is without stones. There is suggestion of gallbladder wall thickening at proximally 4 mm. Mild fat stranding is noted at the gallbladder fossa. Pancreas: Unremarkable. No pancreatic ductal dilatation or surrounding inflammatory changes. Spleen: Normal in size without focal abnormality. Adrenals/Urinary Tract: No adrenal nodule or mass. The kidneys enhance symmetrically. Cysts are present in the right kidney. No renal calculus or hydronephrosis. The bladder is unremarkable. Stomach/Bowel: Stomach is within normal limits. Appendix appears normal. No evidence of bowel wall thickening, distention, or inflammatory changes. No free air or pneumatosis. Vascular/Lymphatic: Aortic atherosclerosis. No enlarged abdominal or pelvic lymph nodes. Reproductive: Prostate gland is not well seen. Other: No abdominopelvic ascites. Small fat containing umbilical hernia. Musculoskeletal:  Degenerative  changes are present in the thoracolumbar spine. No acute osseous abnormality. IMPRESSION: 1. Mild gallbladder wall thickening with fat stranding at the gallbladder fossa. No cholelithiasis is seen. Ultrasound is recommended for further evaluation. 2. Right renal cysts. 3. Aortic atherosclerosis. Electronically Signed   By: Brett Fairy M.D.   On: 11/22/2022 22:36    Anti-infectives: Anti-infectives (From admission, onward)    Start     Dose/Rate Route Frequency Ordered Stop   11/24/22 0000  cefTRIAXone (ROCEPHIN) 2 g in sodium chloride 0.9 % 100 mL IVPB        2 g 200 mL/hr over 30 Minutes Intravenous Every 24 hours 11/23/22 0122     11/23/22 0130  cefTRIAXone (ROCEPHIN) 2 g in sodium chloride 0.9 % 100 mL IVPB        2 g 200 mL/hr over 30 Minutes Intravenous  Once 11/23/22 0124 11/23/22 5035       Assessment/Plan: s/p Procedure(s): LAPAROSCOPIC CHOLECYSTECTOMY (N/A) Plan for lap chole today. Risks and benefits of the surgery discussed with pt as well as some of the technical aspects including cbd injury and he understands and wishes to proceed  LOS: 0 days    Jim Warner 11/24/2022

## 2022-11-24 NOTE — Interval H&P Note (Signed)
History and Physical Interval Note:  11/24/2022 10:36 AM  Jim Warner  has presented today for surgery, with the diagnosis of ACUTE CHOLECYSTITIS.  The various methods of treatment have been discussed with the patient and family. After consideration of risks, benefits and other options for treatment, the patient has consented to  Procedure(s): LAPAROSCOPIC CHOLECYSTECTOMY (N/A) as a surgical intervention.  The patient's history has been reviewed, patient examined, no change in status, stable for surgery.  I have reviewed the patient's chart and labs.  Questions were answered to the patient's satisfaction.     [name]

## 2022-11-24 NOTE — Plan of Care (Signed)

## 2022-11-24 NOTE — Op Note (Signed)
11/24/2022  12:16 PM  PATIENT:  Jim Warner  56 y.o. male  PRE-OPERATIVE DIAGNOSIS:  ACUTE CHOLECYSTITIS  POST-OPERATIVE DIAGNOSIS:  ACUTE CHOLECYSTITIS  PROCEDURE:  Procedure(s): LAPAROSCOPIC CHOLECYSTECTOMY (N/A)  SURGEON:  Surgeon(s) and Role:    * Jovita Kussmaul, MD - Primary  PHYSICIAN ASSISTANT:   ASSISTANTS: none   ANESTHESIA:   local and general  EBL:  minimal   BLOOD ADMINISTERED:none  DRAINS: none   LOCAL MEDICATIONS USED:  MARCAINE     SPECIMEN:  Source of Specimen:  gallbladder  DISPOSITION OF SPECIMEN:  PATHOLOGY  COUNTS:  YES  TOURNIQUET:  * No tourniquets in log *  DICTATION: .Dragon Dictation    Procedure: After informed consent was obtained the patient was brought to the operating room and placed in the supine position on the operating room table. After adequate induction of general anesthesia the patient's abdomen was prepped with ChloraPrep allowed to dry and draped in usual sterile manner. An appropriate timeout was performed. The area below the umbilicus was infiltrated with quarter percent  Marcaine. A small incision was made with a 15 blade knife. The incision was carried down through the subcutaneous tissue bluntly with a hemostat and [employer] retractors. The linea alba was identified. The linea alba was incised with a 15 blade knife and each side was grasped with Coker clamps. The preperitoneal space was then probed with a hemostat until the peritoneum was opened and access was gained to the abdominal cavity. A 0 Vicryl pursestring stitch was placed in the fascia surrounding the opening. A Hassan cannula was then placed through the opening and anchored in place with the previously placed Vicryl purse string stitch. The abdomen was insufflated with carbon dioxide without difficulty. A laparoscope was inserted through the [facility] cannula in the right upper quadrant was inspected. Next the epigastric region was infiltrated with % Marcaine. A small  incision was made with a 15 blade knife. A 5 mm port was placed bluntly through this incision into the abdominal cavity under direct vision. Next 2 sites were chosen laterally on the right side of the abdomen for placement of 5 mm ports. Each of these areas was infiltrated with quarter percent Marcaine. Small stab incisions were made with a 15 blade knife. 5 mm ports were then placed bluntly through these incisions into the abdominal cavity under direct vision without difficulty.  The gallbladder was very thick-walled flamed.  It required aspiration to be able to grasp.  A blunt grasper was placed through the lateralmost 5 mm port and used to grasp the dome of the gallbladder and elevate it anteriorly and superiorly. Another blunt grasper was placed through the other 5 mm port and used to retract the body and neck of the gallbladder. A dissector was placed through the epigastric port and using the electrocautery the peritoneal reflection at the gallbladder neck was opened. Blunt dissection was then carried out in this area until the gallbladder neck-cystic duct junction was readily identified and a good critical window was created. Two clips were placed proximally on the cystic duct and 1 distally and the duct was divided between the 2 sets of clips. Posterior to this the cystic artery was identified and again dissected bluntly in a circumferential manner until a good window  was created. 2 clips were placed proximally and one distally on the artery and the artery was divided between the 2 sets of clips. Next a laparoscopic hook cautery device was used to separate the gallbladder from the liver  bed. Prior to completely detaching the gallbladder from the liver bed the liver bed was inspected and several small bleeding points were coagulated with the electrocautery until the area was completely hemostatic. The gallbladder was then detached the rest of it from the liver bed without difficulty. A laparoscopic bag was  inserted through the hassan port. The laparoscope was moved to the epigastric port. The gallbladder was placed within the bag and the bag was sealed.  The bag with the gallbladder was then removed with the [facility] cannula through the infraumbilical port without difficulty. The fascial defect was then closed with the previously placed Vicryl pursestring stitch as well as with another figure-of-eight 0 Vicryl stitch. The liver bed was inspected again and found to be hemostatic. The abdomen was irrigated with copious amounts of saline until the effluent was clear. The ports were then removed under direct vision without difficulty and were found to be hemostatic. The gas was allowed to escape. No other abnormalities were noted on general inspection of the abdomen. The skin incisions were all closed with interrupted 4-0 Monocryl subcuticular stitches. Dermabond dressings were applied. The patient tolerated the procedure well. At the end of the case all needle sponge and instrument counts were correct. The patient was then awakened and taken to recovery in stable condition  PLAN OF CARE: Admit for overnight observation  PATIENT DISPOSITION:  PACU - hemodynamically stable.   Delay start of Pharmacological VTE agent (>24hrs) due to surgical blood loss or risk of bleeding: no

## 2022-11-24 NOTE — Anesthesia Procedure Notes (Signed)
Procedure Name: Intubation Date/Time: 11/24/2022 11:06 AM  Performed by: Griffin Dakin, CRNAPre-anesthesia Checklist: Patient identified, Emergency Drugs available, Suction available and Patient being monitored Patient Re-evaluated:Patient Re-evaluated prior to induction Oxygen Delivery Method: Circle system utilized Preoxygenation: Pre-oxygenation with 100% oxygen Induction Type: IV induction Ventilation: Mask ventilation without difficulty and Oral airway inserted - appropriate to patient size Laryngoscope Size: Mac and 4 Grade View: Grade I Tube type: Oral Tube size: 7.5 mm Number of attempts: 1 Airway Equipment and Method: Stylet and Oral airway Placement Confirmation: ETT inserted through vocal cords under direct vision, positive ETCO2 and breath sounds checked- equal and bilateral Secured at: 24 cm Tube secured with: Tape Dental Injury: Teeth and Oropharynx as per pre-operative assessment

## 2022-11-24 NOTE — Progress Notes (Signed)
Day of Surgery   Subjective/Chief Complaint: Still having RUQ pain   Objective: Vital signs in last 24 hours: Temp:  [98.3 F (36.8 C)-99 F (37.2 C)] 98.6 F (37 C) (12/08 0821) Pulse Rate:  [39-75] 52 (12/08 0821) Resp:  [16-18] 18 (12/08 0821) BP: (112-139)/(74-90) 115/74 (12/08 0821) SpO2:  [86 %-100 %] 94 % (12/08 0821) Weight:  [86.5 kg] 86.5 kg (12/07 1627) Last BM Date : 11/22/22  Intake/Output from previous day: 12/07 0701 - 12/08 0700 In: 2622.1 [P.O.:720; I.V.:1802.1; IV Piggyback:100] Out: 700 [Urine:700] Intake/Output this shift: No intake/output data recorded.  General appearance: alert and cooperative Resp: clear to auscultation bilaterally Cardio: regular rate and rhythm GI: soft, moderate RUQ pain  Lab Results:  Recent Labs    11/23/22 0733 11/24/22 0450  WBC 6.5 5.2  HGB 14.5 14.3  HCT 42.4 42.6  PLT 253 242   BMET Recent Labs    11/23/22 0733 11/24/22 0450  NA 138 136  K 4.3 4.5  CL 102 99  CO2 28 27  GLUCOSE 117* 125*  BUN 6 7  CREATININE 0.96 1.04  CALCIUM 9.0 8.9   PT/INR No results for input(s): "LABPROT", "INR" in the last 72 hours. ABG No results for input(s): "PHART", "HCO3" in the last 72 hours.  Invalid input(s): "PCO2", "PO2"  Studies/Results: US Abdomen Limited RUQ (LIVER/GB)  Result Date: 11/23/2022 CLINICAL DATA:  Epigastric abdominal pain, nausea EXAM: ULTRASOUND ABDOMEN LIMITED RIGHT UPPER QUADRANT COMPARISON:  None Available. FINDINGS: Gallbladder: The gallbladder contains layering stones and sludge with a 16 mm stone seen impacted within the gallbladder neck. The gallbladder wall is thickened measuring up to 6 mm. No pericholecystic fluid. The sonographic Percell Miller sign is reportedly positive. Common bile duct: Diameter: 5 mm in proximal diameter Liver: No focal lesion identified. Within normal limits in parenchymal echogenicity. Portal vein is patent on color Doppler imaging with normal direction of blood flow towards  the liver. Other: None. IMPRESSION: 1. Cholelithiasis with sonographic evidence of acute cholecystitis. Electronically Signed   By: Fidela Salisbury M.D.   On: 11/23/2022 00:47   CT ABDOMEN PELVIS W CONTRAST  Result Date: 11/22/2022 CLINICAL DATA:  Abdominal pain, bowel obstruction suspected. Worsening epigastric abdominal pain with nausea. EXAM: CT ABDOMEN AND PELVIS WITH CONTRAST TECHNIQUE: Multidetector CT imaging of the abdomen and pelvis was performed using the standard protocol following bolus administration of intravenous contrast. RADIATION DOSE REDUCTION: This exam was performed according to the departmental dose-optimization program which includes automated exposure control, adjustment of the mA and/or kV according to patient size and/or use of iterative reconstruction technique. CONTRAST:  70m OMNIPAQUE IOHEXOL 300 MG/ML  SOLN COMPARISON:  None Available. FINDINGS: Lower chest: Dependent atelectasis is present at the lung bases. Hepatobiliary: No focal abnormality in the liver. No biliary ductal dilatation. The gallbladder is without stones. There is suggestion of gallbladder wall thickening at proximally 4 mm. Mild fat stranding is noted at the gallbladder fossa. Pancreas: Unremarkable. No pancreatic ductal dilatation or surrounding inflammatory changes. Spleen: Normal in size without focal abnormality. Adrenals/Urinary Tract: No adrenal nodule or mass. The kidneys enhance symmetrically. Cysts are present in the right kidney. No renal calculus or hydronephrosis. The bladder is unremarkable. Stomach/Bowel: Stomach is within normal limits. Appendix appears normal. No evidence of bowel wall thickening, distention, or inflammatory changes. No free air or pneumatosis. Vascular/Lymphatic: Aortic atherosclerosis. No enlarged abdominal or pelvic lymph nodes. Reproductive: Prostate gland is not well seen. Other: No abdominopelvic ascites. Small fat containing umbilical hernia. Musculoskeletal:  Degenerative  changes are present in the thoracolumbar spine. No acute osseous abnormality. IMPRESSION: 1. Mild gallbladder wall thickening with fat stranding at the gallbladder fossa. No cholelithiasis is seen. Ultrasound is recommended for further evaluation. 2. Right renal cysts. 3. Aortic atherosclerosis. Electronically Signed   By: Brett Fairy M.D.   On: 11/22/2022 22:36    Anti-infectives: Anti-infectives (From admission, onward)    Start     Dose/Rate Route Frequency Ordered Stop   11/24/22 0000  cefTRIAXone (ROCEPHIN) 2 g in sodium chloride 0.9 % 100 mL IVPB        2 g 200 mL/hr over 30 Minutes Intravenous Every 24 hours 11/23/22 0122     11/23/22 0130  cefTRIAXone (ROCEPHIN) 2 g in sodium chloride 0.9 % 100 mL IVPB        2 g 200 mL/hr over 30 Minutes Intravenous  Once 11/23/22 0124 11/23/22 2924       Assessment/Plan: s/p Procedure(s): LAPAROSCOPIC CHOLECYSTECTOMY (N/A) Plan for lap chole today. Risks and benefits of the surgery discussed with pt as well as some of the technical aspects including cbd injury and he understands and wishes to proceed  LOS: 0 days    Jim Warner 11/24/2022

## 2022-11-24 NOTE — Transfer of Care (Signed)
Immediate Anesthesia Transfer of Care Note  Patient: Jim Warner  Procedure(s) Performed: LAPAROSCOPIC CHOLECYSTECTOMY (Abdomen)  Patient Location: PACU  Anesthesia Type:General  Level of Consciousness: awake, alert , and oriented  Airway & Oxygen Therapy: Patient Spontanous Breathing  Post-op Assessment: Report given to RN and Post -op Vital signs reviewed and stable  Post vital signs: Reviewed and stable  Last Vitals:  Vitals Value Taken Time  BP 128/89 11/24/22 1227  Temp    Pulse 71 11/24/22 1229  Resp 16 11/24/22 1229  SpO2 92 % 11/24/22 1229  Vitals shown include unvalidated device data.  Last Pain:  Vitals:   11/24/22 1010  TempSrc:   PainSc: 4       Patients Stated Pain Goal: 5 (30/16/01 0932)  Complications: No notable events documented.

## 2022-11-25 MED ORDER — OXYCODONE HCL 5 MG PO TABS: 5.0000 mg | ORAL_TABLET | ORAL | Status: DC | PRN

## 2022-11-25 MED ORDER — OXYCODONE HCL 5 MG PO TABS: 5.0000 mg | ORAL_TABLET | Freq: Four times a day (QID) | ORAL | 0 refills | Status: AC | PRN

## 2022-11-25 MED ORDER — HYDROMORPHONE HCL 1 MG/ML IJ SOLN: 0.5000 mg | INTRAMUSCULAR | Status: DC | PRN

## 2022-11-25 NOTE — Plan of Care (Signed)

## 2022-11-25 NOTE — Progress Notes (Signed)
Patient has been discharged. AVS given and patient verbalized understanding.

## 2022-11-25 NOTE — Discharge Summary (Signed)
    Patient ID: Jim Warner [id number] 02/17/1966 56 y.o.  Admit date: 11/22/2022 Discharge date: 11/25/2022   Discharge Diagnosis Acute Cholecystitis s/p lap chole  Consultants None  Reason for Admission: Patient is a 56 year old male comes in secondary to abdominal pain.  He states this began yesterday.  He states that this was epigastric/right upper quadrant.  He states this did radiate to the back.  He states he had some nausea however no emesis.  He states this is his first occurrence of pain.   Patient was seen in outside ER.  Patient underwent Vitor studies and CT scan.  Patient had no leukocytosis and had normal LFTs.  Patient underwent ultrasound which revealed cholelithiasis with gallbladder wall thickening and a normal common bile duct.  I did review the patient's CT scan and laboratory studies personally.     Of note patient recently had robotic prostatectomy by Dr. Louis Meckel May 17, 2022.  Procedures Dr. Marlou Starks - Laparoscopic Cholecystectomy - 11/24/22  Hospital Course:  The patient was admitted and underwent a laparoscopic cholecystectomy.  The patient tolerated the procedure well.  On POD 1, the patient was tolerating a regular diet, voiding well, mobilizing, and pain was controlled.  The patient was stable for DC home at this time with appropriate follow up made. Discussed discharge instructions, restrictions and return/call back precautions.    Physical Exam: Gen:  Alert, NAD, pleasant Card:  RRR Pulm:  CTAB, no W/R/R, effort normal Abd: Soft, no distension, appropriately tender around laparoscopic incisions, no rigidity or guarding and otherwise NT, +BS. Incisions with glue intact appears well and are without drainage, bleeding, or signs of infection  Ext:  No LE edema Psych: A&Ox3   Allergies as of 11/25/2022   No Known Allergies      Medication List     TAKE these medications    cetirizine 10 MG tablet Commonly known as: ZYRTEC Take 10 mg by mouth daily  as needed for allergies.   Clobetasol Propionate 0.05 % shampoo Apply 1 application. topically daily as needed (scalp psoriasis).   clobetasol 0.05 % external solution Commonly known as: TEMOVATE Apply 1 Application topically 2 (two) times daily as needed (for psoriasis).   oxyCODONE 5 MG immediate release tablet Commonly known as: Oxy IR/ROXICODONE Take 1 tablet (5 mg total) by mouth every 6 (six) hours as needed for breakthrough pain.   tadalafil 5 MG tablet Commonly known as: CIALIS Take 5 mg by mouth daily.          Follow-up Information     Sur[facility], [place]. Go on 12/20/2022.   Specialty: General Surgery Why: 12/20/2022 '@11'$ :45. Please arrive 30 minutes prior to your appointment for paperwork. Please bring a copy of your photo ID and insurance card. Contact information: [address] [place] [address] [phone]                 Signed: Alferd Apa, [facility] 11/25/2022, 8:58 AM Please see Amion for pager number during day hours 7:00am-4:30pm

## 2022-11-27 ENCOUNTER — Encounter (HOSPITAL_COMMUNITY): Payer: Self-pay | Admitting: General Surgery

## 2022-11-27 LAB — SURGICAL PATHOLOGY

## 2022-11-28 NOTE — Anesthesia Postprocedure Evaluation (Signed)
Anesthesia Post Note  Patient: Jim Warner  Procedure(s) Performed: LAPAROSCOPIC CHOLECYSTECTOMY (Abdomen)     Patient location during evaluation: PACU Anesthesia Type: General Level of consciousness: awake and alert Pain management: pain level controlled Vital Signs Assessment: post-procedure vital signs reviewed and stable Respiratory status: spontaneous breathing, nonlabored ventilation, respiratory function stable and patient connected to nasal cannula oxygen Cardiovascular status: blood pressure returned to baseline and stable Postop Assessment: no apparent nausea or vomiting Anesthetic complications: no  No notable events documented.  Last Vitals:  Vitals:   11/24/22 1631 11/25/22 0834  BP: 123/82 118/66  Pulse: 76 79  Resp: 18 18  Temp: 37 C 36.7 C  SpO2: 94% 96%    Last Pain:  Vitals:   11/25/22 0834  TempSrc: Oral  PainSc:                  [name]

## 2022-11-28 NOTE — Anesthesia Preprocedure Evaluation (Signed)
Anesthesia Evaluation  Patient identified by MRN, date of birth, ID band Patient awake    Reviewed: Allergy & Precautions, NPO status , Patient's Chart, lab work & pertinent test results  History of Anesthesia Complications Negative for: history of anesthetic complications  Airway Mallampati: III  TM Distance: <3 FB Neck ROM: Full    Dental  (+) Dental Advisory Given   Pulmonary former smoker   breath sounds clear to auscultation       Cardiovascular negative cardio ROS  Rhythm:Regular     Neuro/Psych  PSYCHIATRIC DISORDERS Anxiety Depression    negative neurological ROS     GI/Hepatic negative GI ROS,,,ACUTE CHOLECYSTITIS   Endo/Other  negative endocrine ROS    Renal/GU negative Renal ROS     Musculoskeletal negative musculoskeletal ROS (+)    Abdominal   Peds  Hematology   Anesthesia Other Findings   Reproductive/Obstetrics                             Anesthesia Physical Anesthesia Plan  ASA: 2  Anesthesia Plan: General   Post-op Pain Management:    Induction: Intravenous  PONV Risk Score and Plan: 2 and Ondansetron and Dexamethasone  Airway Management Planned: Oral ETT  Additional Equipment: None  Intra-op Plan:   Post-operative Plan: Extubation in OR  Informed Consent: I have reviewed the patients History and Physical, chart, labs and discussed the procedure including the risks, benefits and alternatives for the proposed anesthesia with the patient or authorized representative who has indicated his/her understanding and acceptance.     Dental advisory given  Plan Discussed with: CRNA  Anesthesia Plan Comments:        Anesthesia Quick Evaluation

## 2022-12-05 MED FILL — Fentanyl Citrate Preservative Free (PF) Inj 100 MCG/2ML: INTRAMUSCULAR | Qty: 1 | Status: AC

## 2022-12-20 ENCOUNTER — Inpatient Hospital Stay: Admission: RE | Admit: 2022-12-20 | Payer: No Typology Code available for payment source | Source: Ambulatory Visit

## 2023-10-29 ENCOUNTER — Other Ambulatory Visit: Payer: Self-pay | Admitting: Family Medicine

## 2023-10-29 DIAGNOSIS — Z87891 Personal history of nicotine dependence: Secondary | ICD-10-CM

## 2023-11-05 ENCOUNTER — Encounter: Payer: Self-pay | Admitting: Family Medicine

## 2023-11-13 ENCOUNTER — Ambulatory Visit
Admission: RE | Admit: 2023-11-13 | Discharge: 2023-11-13 | Disposition: A | Discharge status: Home / Self Care | Payer: BC Managed Care – PPO | Source: Ambulatory Visit | Attending: Family Medicine | Admitting: Family Medicine

## 2023-11-13 ENCOUNTER — Other Ambulatory Visit: Payer: No Typology Code available for payment source

## 2023-11-13 DIAGNOSIS — Z87891 Personal history of nicotine dependence: Secondary | ICD-10-CM
